# Patient Record
Sex: Female | Born: 1970
Health system: Southern US, Community
[De-identification: ages and names within clinical notes are randomized; demographics above are authoritative.]

## PROBLEM LIST (undated history)

## (undated) DIAGNOSIS — K219 Gastro-esophageal reflux disease without esophagitis: Secondary | ICD-10-CM

## (undated) DIAGNOSIS — R221 Localized swelling, mass and lump, neck: Secondary | ICD-10-CM

## (undated) DIAGNOSIS — K5792 Diverticulitis of intestine, part unspecified, without perforation or abscess without bleeding: Secondary | ICD-10-CM

## (undated) DIAGNOSIS — E049 Nontoxic goiter, unspecified: Secondary | ICD-10-CM

## (undated) DIAGNOSIS — R1310 Dysphagia, unspecified: Secondary | ICD-10-CM

## (undated) DIAGNOSIS — C73 Malignant neoplasm of thyroid gland: Secondary | ICD-10-CM

## (undated) DIAGNOSIS — M2550 Pain in unspecified joint: Secondary | ICD-10-CM

## (undated) DIAGNOSIS — F419 Anxiety disorder, unspecified: Secondary | ICD-10-CM

## (undated) DIAGNOSIS — D649 Anemia, unspecified: Secondary | ICD-10-CM

## (undated) DIAGNOSIS — F32A Depression, unspecified: Secondary | ICD-10-CM

## (undated) DIAGNOSIS — K5900 Constipation, unspecified: Secondary | ICD-10-CM

## (undated) DIAGNOSIS — R5383 Other fatigue: Secondary | ICD-10-CM

## (undated) DIAGNOSIS — E739 Lactose intolerance, unspecified: Secondary | ICD-10-CM

## (undated) DIAGNOSIS — R0602 Shortness of breath: Secondary | ICD-10-CM

## (undated) DIAGNOSIS — J45909 Unspecified asthma, uncomplicated: Secondary | ICD-10-CM

## (undated) DIAGNOSIS — K829 Disease of gallbladder, unspecified: Secondary | ICD-10-CM

## (undated) DIAGNOSIS — T7840XA Allergy, unspecified, initial encounter: Secondary | ICD-10-CM

## (undated) DIAGNOSIS — E785 Hyperlipidemia, unspecified: Secondary | ICD-10-CM

## (undated) DIAGNOSIS — E041 Nontoxic single thyroid nodule: Secondary | ICD-10-CM

## (undated) DIAGNOSIS — D219 Benign neoplasm of connective and other soft tissue, unspecified: Secondary | ICD-10-CM

## (undated) HISTORY — DX: Shortness of breath: R06.02

## (undated) HISTORY — DX: Depression, unspecified: F32.A

## (undated) HISTORY — DX: Nontoxic goiter, unspecified: E04.9

## (undated) HISTORY — DX: Unspecified asthma, uncomplicated: J45.909

## (undated) HISTORY — DX: Benign neoplasm of connective and other soft tissue, unspecified: D21.9

## (undated) HISTORY — DX: Dysphagia, unspecified: R13.10

## (undated) HISTORY — DX: Localized swelling, mass and lump, neck: R22.1

## (undated) HISTORY — DX: Anxiety disorder, unspecified: F41.9

## (undated) HISTORY — DX: Nontoxic single thyroid nodule: E04.1

## (undated) HISTORY — DX: Pain in unspecified joint: M25.50

## (undated) HISTORY — DX: Hyperlipidemia, unspecified: E78.5

## (undated) HISTORY — DX: Allergy, unspecified, initial encounter: T78.40XA

## (undated) HISTORY — DX: Anemia, unspecified: D64.9

## (undated) HISTORY — DX: Gastro-esophageal reflux disease without esophagitis: K21.9

## (undated) HISTORY — DX: Lactose intolerance, unspecified: E73.9

## (undated) HISTORY — DX: Constipation, unspecified: K59.00

## (undated) HISTORY — DX: Diverticulitis of intestine, part unspecified, without perforation or abscess without bleeding: K57.92

## (undated) HISTORY — DX: Other fatigue: R53.83

## (undated) HISTORY — DX: Disease of gallbladder, unspecified: K82.9

---

## 2009-04-11 HISTORY — PX: CHOLECYSTECTOMY: SHX55

## 2013-03-11 DIAGNOSIS — E041 Nontoxic single thyroid nodule: Secondary | ICD-10-CM

## 2013-03-11 HISTORY — DX: Nontoxic single thyroid nodule: E04.1

## 2015-04-12 DIAGNOSIS — D219 Benign neoplasm of connective and other soft tissue, unspecified: Secondary | ICD-10-CM

## 2015-04-12 HISTORY — DX: Benign neoplasm of connective and other soft tissue, unspecified: D21.9

## 2016-08-31 DIAGNOSIS — N83209 Unspecified ovarian cyst, unspecified side: Secondary | ICD-10-CM

## 2016-08-31 DIAGNOSIS — N200 Calculus of kidney: Secondary | ICD-10-CM

## 2016-08-31 HISTORY — DX: Calculus of kidney: N20.0

## 2016-08-31 HISTORY — DX: Unspecified ovarian cyst, unspecified side: N83.209

## 2018-10-01 LAB — LIPID PANEL
Cholesterol: 201 — AB (ref 0–200)
HDL: 39 (ref 35–70)
LDL Cholesterol: 138
Triglycerides: 120 (ref 40–160)

## 2018-10-01 LAB — CBC AND DIFFERENTIAL
HCT: 40 (ref 36–46)
Hemoglobin: 12.8 (ref 12.0–16.0)
Neutrophils Absolute: 4.2
Platelets: 331 (ref 150–399)
WBC: 6.1

## 2018-10-01 LAB — COMPREHENSIVE METABOLIC PANEL
Albumin: 4 (ref 3.5–5.0)
Calcium: 9.2 (ref 8.7–10.7)
GFR calc non Af Amer: 90
Globulin: 1

## 2018-10-01 LAB — HEMOGLOBIN A1C: Hemoglobin A1C: 5.4

## 2018-10-01 LAB — BASIC METABOLIC PANEL
BUN: 13 (ref 4–21)
CO2: 24 — AB (ref 13–22)
Chloride: 109 — AB (ref 99–108)
Creatinine: 0.7 (ref 0.5–1.1)
Glucose: 100
Potassium: 4.1 (ref 3.4–5.3)
Sodium: 144 (ref 137–147)

## 2018-10-01 LAB — HEPATIC FUNCTION PANEL
Alkaline Phosphatase: 49 (ref 25–125)
Bilirubin, Total: 0.4

## 2018-10-01 LAB — CBC: RBC: 4.6 (ref 3.87–5.11)

## 2018-10-01 LAB — TSH: TSH: 1.62 (ref 0.41–5.90)

## 2019-05-13 DIAGNOSIS — F330 Major depressive disorder, recurrent, mild: Secondary | ICD-10-CM | POA: Insufficient documentation

## 2019-06-18 DIAGNOSIS — E041 Nontoxic single thyroid nodule: Secondary | ICD-10-CM | POA: Insufficient documentation

## 2019-06-18 DIAGNOSIS — F419 Anxiety disorder, unspecified: Secondary | ICD-10-CM | POA: Insufficient documentation

## 2019-10-07 LAB — HM MAMMOGRAPHY: HM Mammogram: ABNORMAL — AB (ref 0–4)

## 2020-01-02 ENCOUNTER — Other Ambulatory Visit (HOSPITAL_COMMUNITY): Payer: Self-pay

## 2020-03-18 MED FILL — buPROPion HCL ER (XL) 300 M: 300 | 30 days supply | Qty: 30 | Fill #0

## 2020-04-14 MED FILL — buPROPion HCL ER (XL) 300 M: 300 | 30 days supply | Qty: 30 | Fill #1

## 2020-04-27 ENCOUNTER — Ambulatory Visit: Payer: Self-pay | Admitting: Family Medicine

## 2020-05-11 ENCOUNTER — Other Ambulatory Visit: Payer: Self-pay | Admitting: Family Medicine

## 2020-05-11 ENCOUNTER — Other Ambulatory Visit: Payer: Self-pay

## 2020-05-11 ENCOUNTER — Ambulatory Visit (INDEPENDENT_AMBULATORY_CARE_PROVIDER_SITE_OTHER): Payer: 59 | Admitting: Family Medicine

## 2020-05-11 ENCOUNTER — Encounter: Payer: Self-pay | Admitting: Family Medicine

## 2020-05-11 VITALS — BP 89/60 | HR 103 | Temp 98.4°F | Ht 62.5 in | Wt 187.0 lb

## 2020-05-11 DIAGNOSIS — F419 Anxiety disorder, unspecified: Secondary | ICD-10-CM | POA: Diagnosis not present

## 2020-05-11 DIAGNOSIS — F330 Major depressive disorder, recurrent, mild: Secondary | ICD-10-CM | POA: Diagnosis not present

## 2020-05-11 DIAGNOSIS — E041 Nontoxic single thyroid nodule: Secondary | ICD-10-CM

## 2020-05-11 DIAGNOSIS — Z7689 Persons encountering health services in other specified circumstances: Secondary | ICD-10-CM

## 2020-05-11 DIAGNOSIS — E010 Iodine-deficiency related diffuse (endemic) goiter: Secondary | ICD-10-CM

## 2020-05-11 MED ORDER — BUPROPION HCL ER (XL) 300 MG PO TB24
300.0000 mg | ORAL_TABLET | Freq: Every day | ORAL | 1 refills | Status: DC
Start: 2020-05-11 — End: 2020-05-11

## 2020-05-11 MED ORDER — LORAZEPAM 0.5 MG PO TABS: 0.5000 mg | ORAL_TABLET | Freq: Every day | ORAL | 1 refills | Status: DC | PRN

## 2020-05-11 MED FILL — buPROPion HCL ER (XL) 300 M: 300 | 90 days supply | Qty: 90 | Fill #0

## 2020-05-11 MED FILL — LORazepam 0.5 MG TABS: 0.5 | 90 days supply | Qty: 90 | Fill #0

## 2020-05-11 NOTE — Patient Instructions (Addendum)
Nice to meet you today.   I have refilled your meds. Next appt 5.5 mos. For physical and chronic conditions.

## 2020-05-11 NOTE — Progress Notes (Signed)
Patient ID: Patricia Tran, female  DOB: 1970-04-13, 50 y.o.   MRN: [id number] Patient Care Team    Relationship Specialty Notifications Start End  Ma Hillock, DO PCP - General Family Medicine  05/11/20     Chief Complaint  Patient presents with  . Establish Care    [facility]    Subjective: Patricia Tran is a 50 y.o.  female present for new patient establishment. All past medical history, surgical history, allergies, family history, immunizations, medications and social history were updated in the electronic medical record today. All recent labs, ED visits and hospitalizations within the last year were reviewed.  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 She has been prescribed Wellbutrin 300 mg daily and Ativan 0.5 mg daily as needed. She feels her symptoms are well controlled.  Thyroid nodule- 2015- [place] had been consistent in size.  Has had follow up US after biopsy-but no biopsy recently. Patient endorses having noticed enlargement over her left thyroid again and has concerns she needs to have another ultrasound and follow-up.   No flowsheet data found. No flowsheet data found.       Fall Risk  05/11/2020  Falls in the past year? 0  Number falls in past yr: 0  Injury with Fall? 0  Risk for fall due to : No Fall Risks  Follow up Falls evaluation completed   Immunization History  Administered Date(s) Administered  . PFIZER(Purple Top)SARS-COV-2 Vaccination 11/07/2019, 11/28/2019    No exam data present  Past Medical History:  Diagnosis Date  . Allergy   . Anemia   . Asthma   . Depression   . Diverticulitis   . Fibroids   . Goiter   . Thyroid nodule    Allergies  Allergen Reactions  . Buspar [Buspirone]   . Codeine   . Zithromax [Azithromycin]    Past Surgical History:  Procedure Laterality Date  . CHOLECYSTECTOMY  2011   Family History  Problem Relation  Age of Onset  . Early death Mother   . Kidney disease Mother   . Alcohol abuse Father   . Cancer Father        oral HPV  . Diabetes Maternal Grandmother   . Alcohol abuse Maternal Grandfather   . Heart disease Maternal Grandfather   . Heart attack Paternal Grandmother   . Heart attack Paternal Grandfather    Social History   Social History Narrative   Marital status/children/pets: married, 1 child   Education/employment: BA. Building surveyor.    Safety:      -smoke alarm in the home:Yes     - wears seatbelt: Yes     - Feels safe in their relationships: Yes    Allergies as of 05/11/2020   No Known Allergies     Medication List       Accurate as of May 11, 2020 11:59 PM. If you have any questions, ask your nurse or doctor.        STOP taking these medications   busPIRone 5 MG tablet Commonly known as: BUSPAR Stopped by: Howard Pouch, DO     TAKE these medications   buPROPion 300 MG 24 hr tablet Commonly known as: WELLBUTRIN XL Take 1 tablet (300 mg total) by mouth daily.   LORazepam 0.5 MG tablet Commonly known as: ATIVAN Take 1 tablet (0.5 mg total) by mouth daily as needed for anxiety. What changed:   how  much to take  when to take this  reasons to take this Changed by: Howard Pouch, DO       All past medical history, surgical history, allergies, family history, immunizations andmedications were updated in the EMR today and reviewed under the history and medication portions of their EMR.    No results found for this or any previous visit (from the past 2160 hour(s)).  Patient was never admitted.   ROS: 14 pt review of systems performed and negative (unless mentioned in an HPI)  Objective: BP (!) 89/60   Pulse (!) 103   Temp 98.4 F (36.9 C) (Oral)   Ht 5' 2.5" (1.588 m)   Wt 187 lb (84.8 kg)   LMP 05/01/2020   SpO2 99%   BMI 33.66 kg/m  Gen: Afebrile. No acute distress. Nontoxic in appearance, well-developed, well-nourished,  pleasant female HENT: AT. [place].  Eyes:Pupils Equal Round Reactive to light, Extraocular movements intact,  Conjunctiva without redness, discharge or icterus. Neck/lymp/endocrine: Supple, no lymphadenopathy, left thyromegaly present, no discrete nodules palpated CV: RRR  Chest: CTAB, no wheeze, rhonchi or crackles.  Skin: No rashes, purpura or petechiae. Warm and well-perfused. Skin intact. Neuro/Msk: Normal gait. PERLA. EOMi. Alert. Oriented x3.  Cranial nerves II through XII intact. Muscle strength 5/5 upper/lower extremity. DTRs equal bilaterally. Psych: Normal affect, dress and demeanor. Normal speech. Normal thought content and judgment.   Assessment/plan: Patricia Tran is a 50 y.o. 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 Continue Wellbutrin 300 mg daily Continue Ativan 0.5 mg daily as needed [place] controlled substance database reviewed today. Follow-up 5.5 months. Patient is aware will need face-to-face visit prior to any refills of controlled substances.  Thyroid nodule/thyromegaly Agree with patient she does have enlargement of the left side of her thyroid. Received records from prior PCP, no report on thyroid nodule other than benign left thyroid nodule. Will obtain thyroid ultrasound. Patient will be called with results.    Return in about 23 weeks (around 10/19/2020) for CPE (30 min), [facility] (30 min).  Orders Placed This Encounter  Procedures  . HM MAMMOGRAPHY  . US THYROID   Meds ordered this encounter  Medications  . buPROPion (WELLBUTRIN XL) 300 MG 24 hr tablet    Sig: Take 1 tablet (300 mg total) by mouth daily.    Dispense:  90 tablet    Refill:  1  . LORazepam (ATIVAN) 0.5 MG tablet    Sig: Take 1 tablet (0.5 mg total) by mouth daily as needed for anxiety.    Dispense:  90 tablet    Refill:  1   Referral Orders  No referral(s)  requested today     Note is dictated utilizing voice recognition software. Although note has been proof read prior to signing, occasional typographical errors still can be missed. If any questions arise, please do not hesitate to call for verification.  Electronically signed by: Howard Pouch, DO [place]

## 2020-05-18 ENCOUNTER — Encounter: Payer: Self-pay | Admitting: Family Medicine

## 2020-05-26 ENCOUNTER — Encounter: Payer: Self-pay | Admitting: Family Medicine

## 2020-05-27 ENCOUNTER — Encounter: Payer: Self-pay | Admitting: Family Medicine

## 2020-05-27 ENCOUNTER — Other Ambulatory Visit: Payer: Self-pay

## 2020-05-27 ENCOUNTER — Ambulatory Visit
Admission: RE | Admit: 2020-05-27 | Discharge: 2020-05-27 | Disposition: A | Discharge status: Home / Self Care | Payer: 59 | Source: Ambulatory Visit | Attending: Family Medicine | Admitting: Family Medicine

## 2020-05-27 DIAGNOSIS — E010 Iodine-deficiency related diffuse (endemic) goiter: Secondary | ICD-10-CM

## 2020-05-27 DIAGNOSIS — E041 Nontoxic single thyroid nodule: Secondary | ICD-10-CM

## 2020-05-28 ENCOUNTER — Telehealth: Payer: Self-pay | Admitting: Family Medicine

## 2020-05-28 DIAGNOSIS — E041 Nontoxic single thyroid nodule: Secondary | ICD-10-CM

## 2020-05-28 NOTE — Telephone Encounter (Signed)
Talk to patient about her results today.  We have schedule her for fine-needle aspiration of her thyroid nodule.  She has also been referred to establish with endocrine moving forward.  Patient request Dr. Sarina Ser, for who also takes care of her husband.  Patient was appreciative for the call and understands plan.

## 2020-05-29 ENCOUNTER — Other Ambulatory Visit: Payer: Self-pay | Admitting: Family Medicine

## 2020-05-29 DIAGNOSIS — E041 Nontoxic single thyroid nodule: Secondary | ICD-10-CM

## 2020-06-05 ENCOUNTER — Ambulatory Visit: Payer: 59 | Attending: Internal Medicine

## 2020-06-05 DIAGNOSIS — Z23 Encounter for immunization: Secondary | ICD-10-CM

## 2020-06-05 NOTE — Progress Notes (Signed)
   Covid-19 Vaccination Clinic  Name:  Patricia Tran    MRN: [id number] DOB: [birth year]  06/05/2020  [name] was observed post Covid-19 immunization for 15 minutes without incident. She was provided with Vaccine Information Sheet and instruction to access the V-Safe system.   [name] was instructed to call 911 with any severe reactions post vaccine: Marland Kitchen Difficulty breathing  . Swelling of face and throat  . A fast heartbeat  . A bad rash all over body  . Dizziness and weakness   Immunizations Administered    Name Date Dose VIS Date Route   PFIZER Comrnaty(Gray TOP) Covid-19 Vaccine 06/05/2020  3:00 PM 0.3 mL 03/19/2020 Intramuscular   Manufacturer: [place]   Lot: [id number]   NDC: [phone]

## 2020-06-06 ENCOUNTER — Encounter: Payer: Self-pay | Admitting: Internal Medicine

## 2020-06-08 NOTE — Telephone Encounter (Signed)
Please let her know that I am not supposed to give any medical advise to someone who has not established patient / physician relationship.  But let her know [facility] does not do Thyroseq , only [place] and her doctor ordered this appropriately

## 2020-06-09 DIAGNOSIS — D440 Neoplasm of uncertain behavior of thyroid gland: Secondary | ICD-10-CM | POA: Diagnosis not present

## 2020-06-09 HISTORY — PX: BIOPSY THYROID: PRO38

## 2020-06-11 ENCOUNTER — Other Ambulatory Visit: Payer: 59

## 2020-06-15 ENCOUNTER — Ambulatory Visit
Admission: RE | Admit: 2020-06-15 | Discharge: 2020-06-15 | Disposition: A | Discharge status: Home / Self Care | Payer: 59 | Source: Ambulatory Visit | Attending: Family Medicine | Admitting: Family Medicine

## 2020-06-15 ENCOUNTER — Other Ambulatory Visit: Payer: Self-pay

## 2020-06-15 DIAGNOSIS — E041 Nontoxic single thyroid nodule: Secondary | ICD-10-CM | POA: Diagnosis not present

## 2020-06-15 DIAGNOSIS — D440 Neoplasm of uncertain behavior of thyroid gland: Secondary | ICD-10-CM | POA: Insufficient documentation

## 2020-06-15 NOTE — Discharge Instructions (Signed)
Thyroid Needle Biopsy, Care After This sheet gives you information about how to care for yourself after your procedure. Your health care provider may also give you more specific instructions. If you have problems or questions, contact your health care provider. What can I expect after the procedure? After the procedure, it is common to have:  Soreness and tenderness that lasts for a few days.  Bruising where the needle was inserted (puncture site). Follow these instructions at home:  Take over-the-counter and prescription medicines only as told by your health care provider.  To help ease discomfort, keep your head raised (elevated) when you are lying down. When you move from lying down to sitting up, use both hands to support the back of your head and neck.  Check your puncture site every day for signs of infection. Check for: ? Redness, swelling, or pain. ? Fluid or blood. ? Warmth. ? Pus or a bad smell.  Return to your normal activities as told by your health care provider. Ask your health care provider what activities are safe for you.  Keep all follow-up visits as told by your health care provider. This is important.   Contact a health care provider if:  You have redness, swelling, or pain around your puncture site.  You have fluid or blood coming from your puncture site.  Your puncture site feels warm to the touch.  You have pus or a bad smell coming from your puncture site.  You have a fever. Get help right away if:  You have severe bleeding from the puncture site.  You have difficulty swallowing.  You have swollen glands (lymph nodes) in your neck. Summary  It is common to have some bruising and soreness where the needle was inserted in your lower front neck area (puncture site).  Check your puncture site every day for signs of infection, such as redness, swelling, or pain.  Get help right away if you have severe bleeding from your puncture site. This  information is not intended to replace advice given to you by your health care provider. Make sure you discuss any questions you have with your health care provider. Document Revised: 12/05/2019 Document Reviewed: 12/05/2019 Elsevier Patient Education  2021 Elsevier Inc.  

## 2020-06-15 NOTE — Procedures (Signed)
Pre Procedure Dx: Indeterminate left sided thyroid nodule Post Procedural Dx: Same  Technically successful US guided biopsy of indeterminate left sided thyroid nodule.   EBL: None  No immediate complications.   Ronny Bacon, MD Pager #: [phone]

## 2020-06-25 ENCOUNTER — Telehealth: Payer: Self-pay | Admitting: Family Medicine

## 2020-06-25 NOTE — Telephone Encounter (Signed)
Received results from thyroid biopsy 06/15/2020 today (06/25/2020) at 1:15 PM.  Patient was called with her results immediately, which were "suspicious for follicular neoplasm." -Additional testing has been ordered by pathologist, which will take approximately 2-3 weeks to receive.  Patient has and establishment appointment scheduled with Dr. Kelton Pillar at [facility] July 15, 2020.  Patient appreciated the call and had no further questions.

## 2020-06-30 ENCOUNTER — Encounter: Payer: Self-pay | Admitting: Internal Medicine

## 2020-06-30 ENCOUNTER — Telehealth: Payer: Self-pay

## 2020-06-30 ENCOUNTER — Encounter: Payer: Self-pay | Admitting: Family Medicine

## 2020-06-30 DIAGNOSIS — E041 Nontoxic single thyroid nodule: Secondary | ICD-10-CM

## 2020-06-30 DIAGNOSIS — R899 Unspecified abnormal finding in specimens from other organs, systems and tissues: Secondary | ICD-10-CM

## 2020-06-30 LAB — CYTOLOGY - NON PAP

## 2020-06-30 NOTE — Telephone Encounter (Addendum)
Called Patricia Tran 06/30/2020 at 4:12 pm and discussed molecular pathology report with patient.  She would like to establish with endocrine (appointment in 2 weeks) and look into her options,  prior to surgical referral.   She will call us back if needing surgical referral placed, she is desiring to look into surgical/surgeon options with her insurance first.  Patient was appreciative of call.  Forward result to the establishing endocrinologist- Dr. Kelton Pillar.  Result is available under results tab .  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

## 2020-06-30 NOTE — Telephone Encounter (Signed)
CRITICAL VALUE STICKER  Received call 06/30/20 1600 from Dr. Ronnald Ramp with radiology, stating that the pathology results for molecular testing has came back positive and make her at high risk for cancer.

## 2020-07-01 ENCOUNTER — Telehealth: Payer: Self-pay | Admitting: Internal Medicine

## 2020-07-01 NOTE — Telephone Encounter (Signed)
NOted. I will try and see her this week    Thank you

## 2020-07-01 NOTE — Telephone Encounter (Signed)
Referral request for surgical provider at [facility] (Dr. Kathi Simpers)

## 2020-07-01 NOTE — Telephone Encounter (Signed)
Please inform patient I have placed that referral to the provider she requested at [facility].

## 2020-07-01 NOTE — Telephone Encounter (Signed)
Please bring the pt in this week if she can ? Can offer 11:30 if available. We can also bring her next week    Needs to be seen sooner    Thanks

## 2020-07-02 ENCOUNTER — Encounter: Payer: Self-pay | Admitting: Internal Medicine

## 2020-07-02 ENCOUNTER — Other Ambulatory Visit: Payer: Self-pay

## 2020-07-02 ENCOUNTER — Ambulatory Visit: Payer: 59 | Admitting: Internal Medicine

## 2020-07-02 VITALS — BP 120/72 | HR 97 | Resp 20 | Ht 63.0 in | Wt 189.4 lb

## 2020-07-02 DIAGNOSIS — E042 Nontoxic multinodular goiter: Secondary | ICD-10-CM | POA: Diagnosis not present

## 2020-07-02 NOTE — Progress Notes (Addendum)
Name: Patricia Tran  MRN/ DOB: [id number], Aug 14, 1970    Age/ Sex: 50 y.o., female    PCP: Ma Hillock, DO   Reason for Endocrinology Evaluation: MNG     Date of Initial Endocrinology Evaluation: 07/02/2020     HPI: Patricia Tran is a 50 y.o. female with a past medical history of MNG, Depression/anxiety  The patient presented for initial endocrinology clinic visit on 07/02/2020 for consultative assistance with her MNG.   She is accompanied with  her spouse today     She has been diagnosed with MNG in 2014 . She is S/P left thyroid nodule  FNA in 4259 with follicular lesion,  Unable to complete molecular testing due to insufficient material. Repeat FNA in 5638 showed follicular lesion of undetermined significance, thyroseq was benign at the time. Serial ultrasound has shown stability , until 2022 when she noted a local neck enlargement.   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   Thyroseq came back positive with a 60 % cancer risk.   She has tenderness around the neck , chokes occasionally   Has alternating constipation and diarrhea   Has excessive menstrual bleeding as well as fibroids, has not established with a new gynecologist yet. 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   HISTORY:  Past Medical History:  . Past Medical History:  Diagnosis Date  . Allergy   . Anemia   . Asthma   . Depression   . Diverticulitis   . Fibroids 2017   left sided uterine leiomyoma   . Goiter   . Kidney stone 08/31/2016   incidental find on CT right renal calculus  . Ovarian cyst 08/31/2016   1.5 cm left ovarian cyst  . Thyroid nodule 03/2013   2.6x1.9x1.8 cm on last US thyroid 02/2015- "stable".  lg mixed schogenic nodule  left thyroid. "Follicular lesion of unknown significance"    Past Surgical History:  . Past Surgical History:  Procedure Laterality Date  . CHOLECYSTECTOMY  2011      Social History:  reports that she has never smoked. She has never used smokeless tobacco. She reports current alcohol use of about 2.0 standard drinks of alcohol per week. She reports that she does not use drugs.  Family History: family history includes Alcohol abuse in her father and maternal grandfather; Cancer in her father; Diabetes in her maternal grandmother; Early death in her mother; Heart attack in her paternal grandfather and paternal grandmother; Heart disease in her maternal grandfather; Kidney disease in her mother.   HOME MEDICATIONS: Allergies as of 07/02/2020      Reactions   Buspar [buspirone]    Codeine    Zithromax [azithromycin]       Medication List       Accurate as of July 02, 2020  7:07 AM. If you have any questions, ask your nurse or doctor.        buPROPion 300 MG 24 hr tablet Commonly known as: WELLBUTRIN XL Take 1 tablet (300 mg total) by mouth daily.   LORazepam 0.5 MG tablet Commonly known as: ATIVAN Take 1 tablet (0.5 mg total) by mouth daily as needed for anxiety.  REVIEW OF SYSTEMS: A comprehensive ROS was conducted with the patient and is negative except as per HPI    OBJECTIVE:  VS: BP 120/72 (BP Location: Right Arm, Patient Position: Sitting, Cuff Size: Normal)   Pulse 97   Resp 20   Ht 5\' 3"  (1.6 m)   Wt 189 lb 6.4 oz (85.9 kg)   SpO2 99%   BMI 33.55 kg/m    Wt Readings from Last 3 Encounters:  05/11/20 187 lb (84.8 kg)     EXAM: General: Pt appears well and is in NAD  Neck: General: Supple without adenopathy. Thyroid: Left thyroid nodule appreciated.   Lungs: Clear with good BS bilat with no rales, rhonchi, or wheezes  Heart: Auscultation: RRR.  Abdomen: Normoactive bowel sounds, soft, nontender, without masses or organomegaly palpable   Extremities:  BL LE: No pretibial edema normal ROM and strength.  Skin: Hair: Texture and amount normal with gender appropriate distribution Skin Inspection: No rashes Skin Palpation: Skin temperature, texture, and thickness normal to palpation  Neuro: Cranial nerves: II - XII grossly intact  Motor: Normal strength throughout DTRs: 2+ and symmetric in UE without delay in relaxation phase  Mental Status: Judgment, insight: Intact Memory: Intact for recent and remote events Mood and affect: No depression, anxiety, or agitation     DATA REVIEWED: 01/01/2020 LDL 134 mg/dl  HDL 43    BUN/CR 14/0.85 TSH 3.22  Estradiol 376 pg/ml   Thyroid ULtrasound 05/27/2020   Nodule # 1:  Location: Right; Inferior  Maximum size: 1.1 cm; Other 2 dimensions: 0.8 x 0.8 cm  Composition: solid/almost completely solid (2)  Echogenicity: hypoechoic (2)  Shape: not taller-than-wide (0)  Margins: ill-defined (0)  Echogenic foci: none (0)  ACR TI-RADS total points: 4.  ACR TI-RADS risk category: TR4 (4-6 points).  ACR TI-RADS recommendations:  *Given size (>/= 1 - 1.4 cm) and appearance, a follow-up ultrasound in 1 year should be considered based on TI-RADS criteria.  _________________________________________________________  Nodule # 2:  Location: Left; mid  Maximum size: 4.8 cm; Other 2 dimensions: 3.0 x 2.4 cm  Composition: solid/almost completely solid (2)  Echogenicity: hypoechoic (2)  Shape: not taller-than-wide (0)  Margins: ill-defined (0)  Echogenic foci: none (0)  ACR TI-RADS total points: 4.  ACR TI-RADS risk category: TR4 (4-6 points).  ACR TI-RADS recommendations:  **Given size (>/= 1.5 cm) and appearance, fine needle aspiration of this moderately suspicious nodule should be considered based on TI-RADS criteria.  _________________________________________________________  IMPRESSION: 1. Nodule 2 (TI-RADS 4) located in the mid  left thyroid lobe, measuring 4.8 x 3.0 x 2.4 cm, meets criteria for FNA. 2. Nodule 1 (TI-RADS 4) located in the inferior right thyroid lobe, measuring 1.1 x 0.8 x 0.8 cm, meets criteria for imaging follow-up with next ultrasound recommended in 1 year.    FNA 06/15/2020   Specimen Submitted:  A. Thyroid, left lobe   Clinical History: Indeterminate left sided 4.8 cm TR4 thyroid nodule,  post US guided bx.     DIAGNOSIS:  A. 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     Thyroseq  Positive with a 60% risk for cancer      Thyroid ultrasound 02/26/2015  There is a presence of several nodules with the largest measuring 1.1 x 0.7 x 0.6 cm that has remained stable Left thyroid nodule measuring 2.6 x 1.9 x 1.8 cm that has remained stable ASSESSMENT/PLAN/RECOMMENDATIONS:   1. Multinodular Goiter:  -  Patient with local neck symptoms -She is biochemically euthyroid - S/P left thyroid nodule FNA with a cytology report revealing suspicious for follicular neoplasm.  - Thyroseq positive  -She is considering lobectomy, she is interested in robotic thyroid surgery but she is not sure she will qualify given the size of the left thyroid nodule. -I have recommended referral to our local surgeon which she is in agreement of this -We did discuss the presence of a right thyroid nodule and it would not be unreasonable to consider total thyroidectomy -Of note I have reviewed her ultrasound results from 2016 and the right thyroid nodule appears to be stable at 1.1 cm  Follow-up 6 weeks postop Signed electronically by: Mack Guise, MD  [facility]  [facility] [address] [place], [place] [address] Phone: [phone] FAX: [phone]   CC: Ma Hillock, DO [address] [place] [address] Phone: [phone] Fax: [phone]   Return to  Endocrinology clinic as below: Future Appointments  Date Time Provider [place]  07/02/2020 11:30 AM Xoie Kreuser, Melanie Crazier, MD [facility] None  10/19/2020  8:30 AM Kuneff, Renee A, DO [facility]

## 2020-07-03 ENCOUNTER — Encounter: Payer: Self-pay | Admitting: Internal Medicine

## 2020-07-15 ENCOUNTER — Ambulatory Visit: Payer: 59 | Admitting: Internal Medicine

## 2020-07-21 DIAGNOSIS — Z01812 Encounter for preprocedural laboratory examination: Secondary | ICD-10-CM | POA: Diagnosis not present

## 2020-08-07 ENCOUNTER — Other Ambulatory Visit: Payer: Self-pay | Admitting: Family Medicine

## 2020-08-07 DIAGNOSIS — Z1231 Encounter for screening mammogram for malignant neoplasm of breast: Secondary | ICD-10-CM

## 2020-08-09 HISTORY — PX: OTHER SURGICAL HISTORY: SHX169

## 2020-08-11 ENCOUNTER — Encounter (INDEPENDENT_AMBULATORY_CARE_PROVIDER_SITE_OTHER): Payer: Self-pay | Admitting: Family Medicine

## 2020-08-11 ENCOUNTER — Other Ambulatory Visit: Payer: Self-pay

## 2020-08-11 ENCOUNTER — Ambulatory Visit (INDEPENDENT_AMBULATORY_CARE_PROVIDER_SITE_OTHER): Payer: 59 | Admitting: Family Medicine

## 2020-08-11 VITALS — BP 107/67 | HR 90 | Temp 97.7°F | Ht 63.0 in | Wt 185.0 lb

## 2020-08-11 DIAGNOSIS — E669 Obesity, unspecified: Secondary | ICD-10-CM

## 2020-08-11 DIAGNOSIS — R5383 Other fatigue: Secondary | ICD-10-CM

## 2020-08-11 DIAGNOSIS — R0602 Shortness of breath: Secondary | ICD-10-CM | POA: Diagnosis not present

## 2020-08-11 DIAGNOSIS — Z1331 Encounter for screening for depression: Secondary | ICD-10-CM

## 2020-08-11 DIAGNOSIS — Z0289 Encounter for other administrative examinations: Secondary | ICD-10-CM

## 2020-08-11 DIAGNOSIS — F418 Other specified anxiety disorders: Secondary | ICD-10-CM | POA: Diagnosis not present

## 2020-08-11 DIAGNOSIS — E785 Hyperlipidemia, unspecified: Secondary | ICD-10-CM

## 2020-08-11 DIAGNOSIS — Z6832 Body mass index (BMI) 32.0-32.9, adult: Secondary | ICD-10-CM

## 2020-08-11 DIAGNOSIS — Z9189 Other specified personal risk factors, not elsewhere classified: Secondary | ICD-10-CM

## 2020-08-12 LAB — CBC WITH DIFFERENTIAL
Basophils Absolute: 0 10*3/uL (ref 0.0–0.2)
Basos: 1 %
EOS (ABSOLUTE): 0.1 10*3/uL (ref 0.0–0.4)
Eos: 1 %
Hematocrit: 39.9 % (ref 34.0–46.6)
Hemoglobin: 12.6 g/dL (ref 11.1–15.9)
Immature Grans (Abs): 0 10*3/uL (ref 0.0–0.1)
Immature Granulocytes: 0 %
Lymphocytes Absolute: 1.9 10*3/uL (ref 0.7–3.1)
Lymphs: 28 %
MCH: 27.4 pg (ref 26.6–33.0)
MCHC: 31.6 g/dL (ref 31.5–35.7)
MCV: 87 fL (ref 79–97)
Monocytes Absolute: 0.4 10*3/uL (ref 0.1–0.9)
Monocytes: 7 %
Neutrophils Absolute: 4.2 10*3/uL (ref 1.4–7.0)
Neutrophils: 63 %
RBC: 4.6 x10E6/uL (ref 3.77–5.28)
RDW: 15.1 % (ref 11.7–15.4)
WBC: 6.6 10*3/uL (ref 3.4–10.8)

## 2020-08-12 LAB — COMPREHENSIVE METABOLIC PANEL
ALT: 30 IU/L (ref 0–32)
AST: 19 IU/L (ref 0–40)
Albumin/Globulin Ratio: 1.4 (ref 1.2–2.2)
Albumin: 3.9 g/dL (ref 3.8–4.9)
Alkaline Phosphatase: 89 IU/L (ref 44–121)
BUN/Creatinine Ratio: 15 (ref 9–23)
BUN: 12 mg/dL (ref 6–24)
Bilirubin Total: 0.3 mg/dL (ref 0.0–1.2)
CO2: 22 mmol/L (ref 20–29)
Calcium: 8.8 mg/dL (ref 8.7–10.2)
Chloride: 105 mmol/L (ref 96–106)
Creatinine, Ser: 0.82 mg/dL (ref 0.57–1.00)
Globulin, Total: 2.8 g/dL (ref 1.5–4.5)
Glucose: 80 mg/dL (ref 65–99)
Potassium: 4.4 mmol/L (ref 3.5–5.2)
Sodium: 142 mmol/L (ref 134–144)
Total Protein: 6.7 g/dL (ref 6.0–8.5)
eGFR: 87 mL/min/{1.73_m2} (ref >59)

## 2020-08-12 LAB — FOLATE: Folate: 18.6 ng/mL (ref >3.0)

## 2020-08-12 LAB — VITAMIN D 25 HYDROXY (VIT D DEFICIENCY, FRACTURES): Vit D, 25-Hydroxy: 33.7 ng/mL (ref 30.0–100.0)

## 2020-08-12 LAB — HEMOGLOBIN A1C
Est. average glucose Bld gHb Est-mCnc: 108 mg/dL
Hgb A1c MFr Bld: 5.4 % (ref 4.8–5.6)

## 2020-08-12 LAB — LIPID PANEL WITH LDL/HDL RATIO
Cholesterol, Total: 195 mg/dL (ref 100–199)
HDL: 42 mg/dL (ref >39)
LDL Chol Calc (NIH): 130 mg/dL — ABNORMAL HIGH (ref 0–99)
LDL/HDL Ratio: 3.1 ratio (ref 0.0–3.2)
Triglycerides: 127 mg/dL (ref 0–149)
VLDL Cholesterol Cal: 23 mg/dL (ref 5–40)

## 2020-08-12 LAB — VITAMIN B12: Vitamin B-12: 493 pg/mL (ref 232–1245)

## 2020-08-12 LAB — INSULIN, RANDOM: INSULIN: 10.2 u[IU]/mL (ref 2.6–24.9)

## 2020-08-12 MED FILL — Bupropion HCl Tab ER 24HR 300 MG: ORAL | 90 days supply | Qty: 90 | Fill #0 | Status: CN

## 2020-08-12 MED FILL — Lorazepam Tab 0.5 MG: ORAL | 90 days supply | Qty: 90 | Fill #0 | Status: CN

## 2020-08-12 NOTE — Progress Notes (Signed)
Chief Complaint:   OBESITY Patricia Tran (MR# [id number]) is a 50 y.o. female who presents for evaluation and treatment of obesity and related comorbidities. Current BMI is Body mass index is 32.77 kg/m. Patricia Tran has been struggling with her weight for many years and has been unsuccessful in either losing weight, maintaining weight loss, or reaching her healthy weight goal.  Patricia Tran is currently in the action stage of change and ready to dedicate time achieving and maintaining a healthier weight. Patricia Tran is interested in becoming our patient and working on intensive lifestyle modifications including (but not limited to) diet and exercise for weight loss.  Patricia Tran's habits were reviewed today and are as follows: Her family eats meals together, she thinks her family will eat healthier with her, she struggles with family and or coworkers weight loss sabotage, her desired weight loss is 35 lbs, she has been heavy most of her life, she started gaining weight post partum in 1998, her heaviest weight ever was 195 pounds, she is a picky eater and doesn't like to eat healthier foods, she has significant food cravings issues, she snacks frequently in the evenings, she skips meals frequently, she is frequently drinking liquids with calories, she frequently makes poor food choices, she has problems with excessive hunger, she frequently eats larger portions than normal, she has binge eating behaviors and she struggles with emotional eating.  Depression Screen Patricia Tran's Food and Mood (modified PHQ-9) score was 14.  Depression screen PHQ 2/9 08/11/2020  Decreased Interest 2  Down, Depressed, Hopeless 3  PHQ - 2 Score 5  Altered sleeping 0  Tired, decreased energy 0  Change in appetite 0  Feeling bad or failure about yourself  0  Trouble concentrating 0  Moving slowly or fidgety/restless 0  Suicidal thoughts 0  PHQ-9 Score 5  Difficult doing work/chores Not difficult at all   Subjective:   1. Other  fatigue Kirsty notes feeling "exhausted", but is not certain this is weight related. She admits to daytime somnolence and admits to waking up still tired. Patent has a history of symptoms of daytime fatigue. Patricia Tran generally gets 8 hours of sleep per night, and states that she has generally restful sleep. Snoring is present. Apneic episodes are not present. Epworth Sleepiness Score is 5.  2. SOB (shortness of breath) on exertion Patricia Tran notes increasing shortness of breath with exercising and seems to be worsening over time with weight gain. She notes getting out of breath sooner with activity than she used to. This has not gotten worse recently. Patricia Tran denies shortness of breath at rest or orthopnea.  3. Hyperlipidemia, unspecified hyperlipidemia type Patricia Tran is working on diet and weight loss. She is not on statin currently.  4. Depression with anxiety Patricia Tran's diagnosis is treated with medications. Se notes emotional eating behaviors, which contributes to her weight loss struggles.  5. At risk for heart disease Patricia Tran is at a higher than average risk for cardiovascular disease due to obesity.   Assessment/Plan:   1. Other fatigue Patricia Tran does not feel that her weight is causing her energy to be lower than it should be. Fatigue may be related to obesity, depression or many other causes. Labs will be ordered, and in the meanwhile, Patricia Tran will focus on self care including making healthy food choices, increasing physical activity and focusing on stress reduction.  - Insulin, random - Comprehensive metabolic panel - Hemoglobin A1c - Vitamin B12 - VITAMIN D 25 Hydroxy (Vit-D Deficiency, Fractures) - Folate -  CBC With Differential - EKG 12-Lead  2. SOB (shortness of breath) on exertion IC was attempted today, but unable to complete. We will attempt again at her next visit. Patricia Tran has agreed to work on weight loss and gradually increase exercise to treat her exercise induced shortness of breath. Will continue to  monitor closely.  3. Hyperlipidemia, unspecified hyperlipidemia type Cardiovascular risk and specific lipid/LDL goals reviewed. We discussed several lifestyle modifications today. Patricia Tran will start her diet, and will continue working on exercise and weight loss efforts. We will check labs today. Orders and follow up as documented in patient record.   - Lipid Panel With LDL/HDL Ratio  4. Depression with anxiety Behavior modification techniques were discussed today to help Patricia Tran deal with her depression and anxiety. We will refer Patricia Tran to Dr. Mallie Mussel, our Bariatric Psychologist for evaluation and treatment. Orders and follow up as documented in patient record.   5. At risk for heart disease Patricia Tran was given approximately 30 minutes of coronary artery disease prevention counseling today. She is 50 y.o. female and has risk factors for heart disease including obesity. We discussed intensive lifestyle modifications today with an emphasis on specific weight loss instructions and strategies.   Repetitive spaced learning was employed today to elicit superior memory formation and behavioral change.  6. Obesity with current BMI of 32.9 Patricia Tran is currently in the action stage of change and her goal is to continue with weight loss efforts. I recommend Patricia Tran begin the structured treatment plan as follows:  She has agreed to the Category 2 Plan + 100 calories.  Exercise goals: No exercise has been prescribed for now, while we concentrate on nutritional changes.   Behavioral modification strategies: no skipping meals.  She was informed of the importance of frequent follow-up visits to maximize her success with intensive lifestyle modifications for her multiple health conditions. She was informed we would discuss her lab results at her next visit unless there is a critical issue that needs to be addressed sooner. Patricia Tran agreed to keep her next visit at the agreed upon time to discuss these results.  Objective:    Blood pressure 107/67, pulse 90, temperature 97.7 F (36.5 C), height 5\' 3"  (1.6 m), weight 185 lb (83.9 kg), SpO2 99 %. Body mass index is 32.77 kg/m.  EKG: Normal sinus rhythm, rate 82 BPM.  Indirect Calorimeter completed today shows a VO2 of (unable to obtain) and a REE of (unable to obtain).  Her calculated basal metabolic rate is 2992 thus her basal metabolic rate is (unable to obtain) than expected.  General: Cooperative, alert, well developed, in no acute distress. HEENT: Conjunctivae and lids unremarkable. Cardiovascular: Regular rhythm.  Lungs: Normal work of breathing. Neurologic: No focal deficits.   Lab Results  Component Value Date   CREATININE 0.82 08/11/2020   BUN 12 08/11/2020   NA 142 08/11/2020   K 4.4 08/11/2020   CL 105 08/11/2020   CO2 22 08/11/2020   Lab Results  Component Value Date   ALT 30 08/11/2020   AST 19 08/11/2020   ALKPHOS 89 08/11/2020   BILITOT 0.3 08/11/2020   Lab Results  Component Value Date   HGBA1C 5.4 08/11/2020   HGBA1C 5.4 10/01/2018   Lab Results  Component Value Date   INSULIN 10.2 08/11/2020   Lab Results  Component Value Date   TSH 1.62 10/01/2018   Lab Results  Component Value Date   CHOL 195 08/11/2020   HDL 42 08/11/2020   LDLCALC 130 (  H) 08/11/2020   TRIG 127 08/11/2020   Lab Results  Component Value Date   WBC 6.6 08/11/2020   HGB 12.6 08/11/2020   HCT 39.9 08/11/2020   MCV 87 08/11/2020   PLT 331 10/01/2018   No results found for: IRON, TIBC, FERRITIN  Attestation Statements:   Reviewed by clinician on day of visit: allergies, medications, problem list, medical history, surgical history, family history, social history, and previous encounter notes.   I, Trixie Dredge, am acting as transcriptionist for Dennard Nip, MD.  I have reviewed the above documentation for accuracy and completeness, and I agree with the above. - Dennard Nip, MD

## 2020-08-13 ENCOUNTER — Other Ambulatory Visit (HOSPITAL_COMMUNITY): Payer: Self-pay

## 2020-08-18 ENCOUNTER — Encounter (INDEPENDENT_AMBULATORY_CARE_PROVIDER_SITE_OTHER): Payer: Self-pay | Admitting: Family Medicine

## 2020-08-19 NOTE — Telephone Encounter (Signed)
Pt last seen by Dr. Beasley.  

## 2020-08-20 NOTE — Telephone Encounter (Signed)
I have talked to this patient.  The plan is to touch base with her on Sunday.  She is going to try to keep this appt.

## 2020-08-21 ENCOUNTER — Other Ambulatory Visit (HOSPITAL_COMMUNITY): Payer: Self-pay

## 2020-08-21 MED FILL — Bupropion HCl Tab ER 24HR 300 MG: ORAL | 90 days supply | Qty: 90 | Fill #0 | Status: AC

## 2020-08-21 MED FILL — Lorazepam Tab 0.5 MG: ORAL | 90 days supply | Qty: 90 | Fill #0 | Status: AC

## 2020-08-25 ENCOUNTER — Ambulatory Visit (INDEPENDENT_AMBULATORY_CARE_PROVIDER_SITE_OTHER): Payer: 59 | Admitting: Family Medicine

## 2020-08-25 ENCOUNTER — Encounter (INDEPENDENT_AMBULATORY_CARE_PROVIDER_SITE_OTHER): Payer: Self-pay | Admitting: Family Medicine

## 2020-08-25 ENCOUNTER — Other Ambulatory Visit (HOSPITAL_COMMUNITY): Payer: Self-pay

## 2020-08-25 ENCOUNTER — Other Ambulatory Visit: Payer: Self-pay

## 2020-08-25 VITALS — BP 118/79 | HR 111 | Temp 98.1°F | Ht 63.0 in | Wt 182.0 lb

## 2020-08-25 DIAGNOSIS — E7849 Other hyperlipidemia: Secondary | ICD-10-CM

## 2020-08-25 DIAGNOSIS — E559 Vitamin D deficiency, unspecified: Secondary | ICD-10-CM | POA: Diagnosis not present

## 2020-08-25 DIAGNOSIS — E041 Nontoxic single thyroid nodule: Secondary | ICD-10-CM | POA: Diagnosis not present

## 2020-08-25 DIAGNOSIS — E669 Obesity, unspecified: Secondary | ICD-10-CM

## 2020-08-25 DIAGNOSIS — E8881 Metabolic syndrome: Secondary | ICD-10-CM

## 2020-08-25 DIAGNOSIS — Z9189 Other specified personal risk factors, not elsewhere classified: Secondary | ICD-10-CM | POA: Diagnosis not present

## 2020-08-25 DIAGNOSIS — Z6832 Body mass index (BMI) 32.0-32.9, adult: Secondary | ICD-10-CM | POA: Diagnosis not present

## 2020-08-25 MED ORDER — VITAMIN D (ERGOCALCIFEROL) 1.25 MG (50000 UNIT) PO CAPS
50000.0000 [IU] | ORAL_CAPSULE | ORAL | 0 refills | Status: DC
  Filled 2020-08-25: qty 4, 28d supply, fill #0

## 2020-08-26 ENCOUNTER — Encounter: Payer: 59 | Admitting: Family Medicine

## 2020-08-26 DIAGNOSIS — E8881 Metabolic syndrome: Secondary | ICD-10-CM | POA: Insufficient documentation

## 2020-08-26 DIAGNOSIS — E559 Vitamin D deficiency, unspecified: Secondary | ICD-10-CM | POA: Insufficient documentation

## 2020-08-26 DIAGNOSIS — E7849 Other hyperlipidemia: Secondary | ICD-10-CM | POA: Insufficient documentation

## 2020-08-26 DIAGNOSIS — E88819 Insulin resistance, unspecified: Secondary | ICD-10-CM | POA: Insufficient documentation

## 2020-08-26 LAB — T3: T3, Total: 119 ng/dL (ref 71–180)

## 2020-08-26 LAB — TSH: TSH: 1.48 u[IU]/mL (ref 0.450–4.500)

## 2020-08-26 LAB — T4, FREE: Free T4: 1.2 ng/dL (ref 0.82–1.77)

## 2020-08-26 NOTE — Progress Notes (Signed)
Chief Complaint:   OBESITY Patricia Tran is here to discuss her progress with her obesity treatment plan along with follow-up of her obesity related diagnoses. Patricia Tran is on the Category 2 Plan + 100 calories and states she is following her eating plan approximately 90-95% of the time. Patricia Tran states she is walking for 60 minutes 5 times per week.  Today's visit was #: 2 Starting weight: 185 lbs Starting date: 08/11/2020 Today's weight: 182 lbs Today's date: 08/25/2020 Total lbs lost to date: 3 Total lbs lost since last in-office visit: 3  Interim History: Patricia Tran has done well with weight loss on her plan. Her hunger was mostly controlled, but she struggled to eat all of her lunch at times.  Subjective:   1. Other hyperlipidemia Patricia Tran's LDL is elevated and she is not on statin. She is working on diet, exercise, and weight loss. She denies chest pain. I discussed labs with the patient today.  2. Vitamin D deficiency Patricia Tran's Vit D level is below goal, and she notes fatigue. I discussed labs with the patient today.  3. Insulin resistance Patricia Tran has a new diagnosis of insulin resistance. Her glucose and A1c were with normal limits, but her fasting insulin is elevated. She notes polyphagia at times but no signs of hypoglycemia. I discussed labs with the patient today.  4. Thyroid nodule Patricia Tran has a history of a thyroid nodule. Thyroid labs did not yet resulted. She notes fatigue, but denies palpitations or tremors.  5. At risk for diabetes mellitus Patricia Tran is at higher than average risk for developing diabetes due to obesity.   Assessment/Plan:   1. Other hyperlipidemia Cardiovascular risk and specific lipid/LDL goals reviewed.  We discussed several lifestyle modifications today. Patricia Tran will continue to work on diet, exercise and weight loss efforts. We will recheck labs in 3 months. Orders and follow up as documented in patient record.   2. Vitamin D deficiency Low Vitamin D level contributes to fatigue  and are associated with obesity, breast, and colon cancer. Patricia Tran agreed to start prescription Vitamin D 50,000 IU every week with no refills. We will recheck labs in 3 months. She will follow-up for routine testing of Vitamin D, at least 2-3 times per year to avoid over-replacement.  - Vitamin D, Ergocalciferol, (DRISDOL) 1.25 MG (50000 UNIT) CAPS capsule; Take 1 capsule (50,000 Units total) by mouth every 7 (seven) days.  Dispense: 4 capsule; Refill: 0  3. Insulin resistance Patricia Tran will continue to work on diet, exercise, and decreasing simple carbohydrates to help decrease the risk of diabetes. She was educated on how various foods affect this and her weight. We will recheck labs in 3 months. Patricia Tran agreed to follow-up with [place] as directed to closely monitor her progress.  4. Thyroid nodule We will check labs today. Patricia Tran will continue to follow up as directed. Orders and follow up as documented in patient record.  - T3 - T4, free - TSH  5. At risk for diabetes mellitus Patricia Tran was given approximately 30 minutes of diabetes education and counseling today. We discussed intensive lifestyle modifications today with an emphasis on weight loss as well as increasing exercise and decreasing simple carbohydrates in her diet. We also reviewed medication options with an emphasis on risk versus benefit of those discussed.   Repetitive spaced learning was employed today to elicit superior memory formation and behavioral change.  6. Obesity with current BMI 32.3 Patricia Tran is currently in the action stage of change. As such, her goal  is to continue with weight loss efforts. She has agreed to the Category 2 Plan with lunch options.   Exercise goals: As is.  Behavioral modification strategies: increasing lean protein intake and decreasing simple carbohydrates.  Patricia Tran has agreed to follow-up with our clinic in 2 weeks. She was informed of the importance of frequent follow-up visits to maximize her success with intensive  lifestyle modifications for her multiple health conditions.   Patricia Tran was informed we would discuss her lab results at her next visit unless there is a critical issue that needs to be addressed sooner. Patricia Tran agreed to keep her next visit at the agreed upon time to discuss these results.  Objective:   Blood pressure 118/79, pulse (!) 111, temperature 98.1 F (36.7 C), height 5\' 3"  (1.6 m), weight 182 lb (82.6 kg), SpO2 97 %. Body mass index is 32.24 kg/m.  General: Cooperative, alert, well developed, in no acute distress. HEENT: Conjunctivae and lids unremarkable. Cardiovascular: Regular rhythm.  Lungs: Normal work of breathing. Neurologic: No focal deficits.   Lab Results  Component Value Date   CREATININE 0.82 08/11/2020   BUN 12 08/11/2020   NA 142 08/11/2020   K 4.4 08/11/2020   CL 105 08/11/2020   CO2 22 08/11/2020   Lab Results  Component Value Date   ALT 30 08/11/2020   AST 19 08/11/2020   ALKPHOS 89 08/11/2020   BILITOT 0.3 08/11/2020   Lab Results  Component Value Date   HGBA1C 5.4 08/11/2020   HGBA1C 5.4 10/01/2018   Lab Results  Component Value Date   INSULIN 10.2 08/11/2020   Lab Results  Component Value Date   TSH 1.480 08/25/2020   Lab Results  Component Value Date   CHOL 195 08/11/2020   HDL 42 08/11/2020   LDLCALC 130 (H) 08/11/2020   TRIG 127 08/11/2020   Lab Results  Component Value Date   WBC 6.6 08/11/2020   HGB 12.6 08/11/2020   HCT 39.9 08/11/2020   MCV 87 08/11/2020   PLT 331 10/01/2018   No results found for: IRON, TIBC, FERRITIN  Attestation Statements:   Reviewed by clinician on day of visit: allergies, medications, problem list, medical history, surgical history, family history, social history, and previous encounter notes.   I, Trixie Dredge, am acting as transcriptionist for Dennard Nip, MD.  I have reviewed the above documentation for accuracy and completeness, and I agree with the above. -  Dennard Nip, MD

## 2020-08-31 ENCOUNTER — Ambulatory Visit (INDEPENDENT_AMBULATORY_CARE_PROVIDER_SITE_OTHER): Payer: 59 | Admitting: Family Medicine

## 2020-08-31 ENCOUNTER — Other Ambulatory Visit (HOSPITAL_COMMUNITY): Payer: Self-pay

## 2020-08-31 ENCOUNTER — Encounter: Payer: Self-pay | Admitting: Family Medicine

## 2020-08-31 ENCOUNTER — Other Ambulatory Visit: Payer: Self-pay

## 2020-08-31 VITALS — BP 128/75 | HR 106 | Temp 97.6°F | Ht 63.0 in | Wt 184.0 lb

## 2020-08-31 DIAGNOSIS — E041 Nontoxic single thyroid nodule: Secondary | ICD-10-CM

## 2020-08-31 DIAGNOSIS — Z01419 Encounter for gynecological examination (general) (routine) without abnormal findings: Secondary | ICD-10-CM | POA: Diagnosis not present

## 2020-08-31 DIAGNOSIS — Z1159 Encounter for screening for other viral diseases: Secondary | ICD-10-CM | POA: Diagnosis not present

## 2020-08-31 DIAGNOSIS — E559 Vitamin D deficiency, unspecified: Secondary | ICD-10-CM | POA: Diagnosis not present

## 2020-08-31 DIAGNOSIS — D219 Benign neoplasm of connective and other soft tissue, unspecified: Secondary | ICD-10-CM

## 2020-08-31 DIAGNOSIS — Z0000 Encounter for general adult medical examination without abnormal findings: Secondary | ICD-10-CM | POA: Diagnosis not present

## 2020-08-31 DIAGNOSIS — F419 Anxiety disorder, unspecified: Secondary | ICD-10-CM

## 2020-08-31 DIAGNOSIS — Z23 Encounter for immunization: Secondary | ICD-10-CM | POA: Diagnosis not present

## 2020-08-31 DIAGNOSIS — Z1211 Encounter for screening for malignant neoplasm of colon: Secondary | ICD-10-CM | POA: Diagnosis not present

## 2020-08-31 DIAGNOSIS — F330 Major depressive disorder, recurrent, mild: Secondary | ICD-10-CM

## 2020-08-31 DIAGNOSIS — E7849 Other hyperlipidemia: Secondary | ICD-10-CM

## 2020-08-31 MED ORDER — LORAZEPAM 0.5 MG PO TABS
0.5000 mg | ORAL_TABLET | Freq: Every day | ORAL | 1 refills | Status: DC | PRN
  Filled 2020-08-31: qty 90, fill #0
  Filled 2020-10-21 – 2020-12-01 (×4): qty 90, 90d supply, fill #0
  Filled 2021-01-22: qty 90, 90d supply, fill #1

## 2020-08-31 MED ORDER — BUPROPION HCL ER (XL) 300 MG PO TB24
300.0000 mg | ORAL_TABLET | Freq: Every day | ORAL | 1 refills | Status: DC
  Filled 2020-08-31: qty 90, fill #0
  Filled 2020-10-21 – 2020-10-29 (×2): qty 90, 90d supply, fill #0
  Filled 2021-01-22: qty 90, 90d supply, fill #1

## 2020-08-31 NOTE — Patient Instructions (Signed)
Great to see you today.  I have refilled the medication(s) we provide.   If labs were collected, we will inform you of lab results once received either by echart message or telephone call.   - echart message- for normal results that have been seen by the patient already.   - telephone call: abnormal results or if patient has not viewed results in their echart.    Health Maintenance, Female Adopting a healthy lifestyle and getting preventive care are important in promoting health and wellness. Ask your health care provider about:  The right schedule for you to have regular tests and exams.  Things you can do on your own to prevent diseases and keep yourself healthy. What should I know about diet, weight, and exercise? Eat a healthy diet  Eat a diet that includes plenty of vegetables, fruits, low-fat dairy products, and lean protein.  Do not eat a lot of foods that are high in solid fats, added sugars, or sodium.   Maintain a healthy weight Body mass index (BMI) is used to identify weight problems. It estimates body fat based on height and weight. Your health care provider can help determine your BMI and help you achieve or maintain a healthy weight. Get regular exercise Get regular exercise. This is one of the most important things you can do for your health. Most adults should:  Exercise for at least 150 minutes each week. The exercise should increase your heart rate and make you sweat (moderate-intensity exercise).  Do strengthening exercises at least twice a week. This is in addition to the moderate-intensity exercise.  Spend less time sitting. Even light physical activity can be beneficial. Watch cholesterol and blood lipids Have your blood tested for lipids and cholesterol at 52 years of age, then have this test every 5 years. Have your cholesterol levels checked more often if:  Your lipid or cholesterol levels are high.  You are older than 52 years of age.  You are at  high risk for heart disease. What should I know about cancer screening? Depending on your health history and family history, you may need to have cancer screening at various ages. This may include screening for:  Breast cancer.  Cervical cancer.  Colorectal cancer.  Skin cancer.  Lung cancer. What should I know about heart disease, diabetes, and high blood pressure? Blood pressure and heart disease  High blood pressure causes heart disease and increases the risk of stroke. This is more likely to develop in people who have high blood pressure readings, are of African descent, or are overweight.  Have your blood pressure checked: ? Every 3-5 years if you are 80-47 years of age. ? Every year if you are 60 years old or older. Diabetes Have regular diabetes screenings. This checks your fasting blood sugar level. Have the screening done:  Once every three years after age 24 if you are at a normal weight and have a low risk for diabetes.  More often and at a younger age if you are overweight or have a high risk for diabetes. What should I know about preventing infection? Hepatitis B If you have a higher risk for hepatitis B, you should be screened for this virus. Talk with your health care provider to find out if you are at risk for hepatitis B infection. Hepatitis C Testing is recommended for:  Everyone born from [birth year] through 1967.  Anyone with known risk factors for hepatitis C. Sexually transmitted infections (STIs)  Get screened for  STIs, including gonorrhea and chlamydia, if: ? You are sexually active and are younger than 51 years of age. ? You are older than 52 years of age and your health care provider tells you that you are at risk for this type of infection. ? Your sexual activity has changed since you were last screened, and you are at increased risk for chlamydia or gonorrhea. Ask your health care provider if you are at risk.  Ask your health care provider about whether  you are at high risk for HIV. Your health care provider may recommend a prescription medicine to help prevent HIV infection. If you choose to take medicine to prevent HIV, you should first get tested for HIV. You should then be tested every 3 months for as long as you are taking the medicine. Pregnancy  If you are about to stop having your period (premenopausal) and you may become pregnant, seek counseling before you get pregnant.  Take 400 to 800 micrograms (mcg) of folic acid every day if you become pregnant.  Ask for birth control (contraception) if you want to prevent pregnancy. Osteoporosis and menopause Osteoporosis is a disease in which the bones lose minerals and strength with aging. This can result in bone fractures. If you are 54 years old or older, or if you are at risk for osteoporosis and fractures, ask your health care provider if you should:  Be screened for bone loss.  Take a calcium or vitamin D supplement to lower your risk of fractures.  Be given hormone replacement therapy (HRT) to treat symptoms of menopause. Follow these instructions at home: Lifestyle  Do not use any products that contain nicotine or tobacco, such as cigarettes, e-cigarettes, and chewing tobacco. If you need help quitting, ask your health care provider.  Do not use street drugs.  Do not share needles.  Ask your health care provider for help if you need support or information about quitting drugs. Alcohol use  Do not drink alcohol if: ? Your health care provider tells you not to drink. ? You are pregnant, may be pregnant, or are planning to become pregnant.  If you drink alcohol: ? Limit how much you use to 0-1 drink a day. ? Limit intake if you are breastfeeding.  Be aware of how much alcohol is in your drink. In the [place] one drink equals one 12 oz bottle of beer (355 mL), one 5 oz glass of wine (148 mL), or one 1 oz glass of hard liquor (44 mL). General instructions  Schedule regular  health, dental, and eye exams.  Stay current with your vaccines.  Tell your health care provider if: ? You often feel depressed. ? You have ever been abused or do not feel safe at home. Summary  Adopting a healthy lifestyle and getting preventive care are important in promoting health and wellness.  Follow your health care provider's instructions about healthy diet, exercising, and getting tested or screened for diseases.  Follow your health care provider's instructions on monitoring your cholesterol and blood pressure. This information is not intended to replace advice given to you by your health care provider. Make sure you discuss any questions you have with your health care provider. Document Revised: 03/21/2018 Document Reviewed: 03/21/2018 Elsevier Patient Education  2021 [employer].

## 2020-08-31 NOTE — Progress Notes (Signed)
This visit occurred during the SARS-CoV-2 public health emergency.  Safety protocols were in place, including screening questions prior to the visit, additional usage of staff PPE, and extensive cleaning of exam room while observing appropriate contact time as indicated for disinfecting solutions.    Patient ID: Patricia Tran, female  DOB: [birth year], 50 y.o.   MRN: [id number] Patient Care Team    Relationship Specialty Notifications Start End  Ma Hillock, DO PCP - General Family Medicine  05/11/20   Shamleffer, Melanie Crazier, MD Consulting Physician Endocrinology  08/31/20   Lindon Romp, MD Referring Physician General Surgery  08/31/20     Chief Complaint  Patient presents with  . Annual Exam    Pt is fasting    Subjective: Patricia Tran is a 50 y.o.  Female  present for CPE/CMC. All past medical history, surgical history, allergies, family history, immunizations, medications and social history were updated in the electronic medical record today. All recent labs, ED visits and hospitalizations within the last year were reviewed. Reviewed recent labs collected at weight loss clinic- Dr. Leafy Ro. CBC, CMP, TSH, A1c and lipids are normal.  B12 and vit d also in normal range.   Health maintenance:  Colonoscopy: no fhx. Never screened.> referred to Dr. Hilarie Fredrickson Mammogram: completed: 09/2019, >[facility] Cervical cancer screening: last pap: 12/2019,referred to gyn Dr. Monica Becton.  Patient's last menstrual period was 08/20/2020. G1P1 Immunizations: tdap administered today, Influenza UTD(encouraged yearly), zostavax declined, covid series completed Infectious disease screening: HIV completed with pregnancy, Hep C completed today DEXA: consider early screen Assistive device: none Oxygen EUM:PNTI Patient has a Dental home. 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 She has been prescribed Wellbutrin 300 mg daily and Ativan 0.5 mg daily as needed. She feels her symptoms are well controlled on these medications. She is going through a tough time with thyroid cancer   Thyroid nodule: Rpt [place] and bx where concerning for malignancy. Pt is scheduled to undergo left thyroidectomy next week with Dr. Kathi Simpers.  Prior note: Has had follow up US after biopsy-but no biopsy recently. Patient endorses having noticed enlargement over her left thyroid again and has concerns she needs to have another ultrasound and follow-up.   Depression screen [facility] 2/9 08/31/2020 08/11/2020  Decreased Interest 0 2  Down, Depressed, Hopeless 1 3  PHQ - 2 Score 1 5  Altered sleeping 2 0  Tired, decreased energy 1 0  Change in appetite 1 0  Feeling bad or failure about yourself  2 0  Trouble concentrating 0 0  Moving slowly or fidgety/restless 0 0  Suicidal thoughts 0 0  PHQ-9 Score 7 5  Difficult doing work/chores - Not difficult at all   GAD 7 : Generalized Anxiety Score 08/31/2020  Nervous, Anxious, on Edge 2  Control/stop worrying 2  Worry too much - different things 1  Trouble relaxing 0  Restless 0  Easily annoyed or irritable 1  Afraid - awful might happen 0  Total GAD 7 Score 6     Immunization History  Administered Date(s) Administered  . PFIZER Comirnaty(Gray Top)Covid-19 Tri-Sucrose Vaccine 06/05/2020  . PFIZER(Purple Top)SARS-COV-2 Vaccination 11/07/2019, 11/28/2019  . Tdap 08/31/2020     Past Medical History:  Diagnosis Date  . Allergy   . Anemia   . Anxiety   . Asthma   . Constipation   . Depression   . Diverticulitis   .  Fibroids 2017   left sided uterine leiomyoma   . Gallbladder disease   . GERD (gastroesophageal reflux disease)   . Goiter   . Hyperlipidemia   . Joint pain   . Kidney stone 08/31/2016   incidental find on CT right renal calculus  . Lactose intolerance   . Lump in neck   . Other fatigue   .  Ovarian cyst 08/31/2016   1.5 cm left ovarian cyst  . SOB (shortness of breath) on exertion   . Swallowing difficulty   . Thyroid nodule 03/2013   2.6x1.9x1.8 cm on last US thyroid 02/2015- "stable".  lg mixed schogenic nodule left thyroid. "Follicular lesion of unknown significance"   Allergies  Allergen Reactions  . Codeine Itching   Past Surgical History:  Procedure Laterality Date  . BIOPSY THYROID  06/2020  . CHOLECYSTECTOMY  2011  . TRANSAXILLERY ENDOSCOPIC LEFT THYROID LOBECTOMY WITH ROBOTIC ASSISTANCE Left 08/2020   scheduled end of May 2022.nodule- high concern for malignancy   Family History  Problem Relation Age of Onset  . Early death Mother   . Kidney disease Mother   . Alcohol abuse Father   . Cancer Father        oral HPV  . Alcoholism Father   . Obesity Father   . Colitis Father   . Colon polyps Father   . Diabetes Maternal Grandmother   . Alcohol abuse Maternal Grandfather   . Heart disease Maternal Grandfather   . Heart attack Paternal Grandmother   . Heart attack Paternal Grandfather    Social History   Social History Narrative   Marital status/children/pets: married, 1 child   Education/employment: BA. Building surveyor.    Safety:      -smoke alarm in the home:Yes     - wears seatbelt: Yes     - Feels safe in their relationships: Yes    Allergies as of 08/31/2020      Reactions   Codeine Itching      Medication List       Accurate as of Aug 31, 2020  9:50 AM. If you have any questions, ask your nurse or doctor.        buPROPion 300 MG 24 hr tablet Commonly known as: WELLBUTRIN XL Take 1 tablet (300 mg total) by mouth daily. What changed: how much to take Changed by: Howard Pouch, DO   LORazepam 0.5 MG tablet Commonly known as: ATIVAN Take 1 tablet (0.5 mg total) by mouth daily as needed for anxiety. What changed:   how much to take  how to take this  when to take this  reasons to take this Changed by: Howard Pouch,  DO   Vitamin D (Ergocalciferol) 1.25 MG (50000 UNIT) Caps capsule Commonly known as: DRISDOL Take 1 capsule (50,000 Units total) by mouth every 7 (seven) days.       All past medical history, surgical history, allergies, family history, immunizations andmedications were updated in the EMR today and reviewed under the history and medication portions of their EMR.      US Thyroid Biopsy Result Date: 06/15/2020 IMPRESSION: Technically successful ultrasound guided fine needle aspiration of indeterminate TR4 nodule within the mid aspect of the left lobe of the thyroid (labeled #2). Electronically Signed   By: Sandi Mariscal M.D.   On: 06/15/2020 14:10    ROS: 14 pt review of systems performed and negative (unless mentioned in an HPI)  Objective: BP 128/75   Pulse (!) 106  Temp 97.6 F (36.4 C) (Oral)   Ht 5\' 3"  (1.6 m)   Wt 184 lb (83.5 kg)   LMP 08/20/2020   SpO2 99%   BMI 32.59 kg/m  Gen: Afebrile. No acute distress. Nontoxic in appearance, well-developed, well-nourished,  Obese very pleasant female.  HENT: AT. [place]. Bilateral TM visualized and normal in appearance, normal external auditory canal. MMM, no oral lesions, adequate dentition. Bilateral nares within normal limits. Throat without erythema, ulcerations or exudates. no Cough on exam, no hoarseness on exam. Eyes:Pupils Equal Round Reactive to light, Extraocular movements intact,  Conjunctiva without redness, discharge or icterus. Neck/lymp/endocrine: Supple,no lymphadenopathy,  Thyromegaly present CV: RRR no murmur, no edema, +2/4 P posterior tibialis pulses.  Chest: CTAB, no wheeze, rhonchi or crackles. normal Respiratory effort. good Air movement. Abd: Soft. flat. Mild TTP LLQ to deep palpation, ND. BS present. no Masses palpated. No hepatosplenomegaly. No rebound tenderness or guarding. Skin: no rashes, purpura or petechiae. Warm and well-perfused. Skin intact. Neuro/Msk:  Normal gait. PERLA. EOMi. Alert. Oriented x3.  Cranial  nerves II through XII intact. Muscle strength 5/5 upper/lower extremity. DTRs equal bilaterally. Psych: Normal affect, dress and demeanor. Normal speech. Normal thought content and judgment.  No exam data present  Assessment/plan: Summers Miura is a 50 y.o. female present for [place] Anxiety/Mild episode of recurrent major depressive disorder ([place]) Stable.  Continue Wellbutrin 300 mg daily Continue  Ativan 0.5 mg daily as needed [place] controlled substance database reviewed 08/31/20 Follow-up 5.5 months. Patient is aware will need face-to-face visit prior to any refills of controlled substances.  Thyroid nodule Scheduled next week for left thyroidectomy for nodule concerning for malignancy.   Routine general medical examination at a health care facility Colonoscopy: no fhx. Never screened.> referred to Dr. Hilarie Fredrickson Mammogram: completed: 09/2019, >[facility]uled [facility] Cervical cancer screening: last pap: 12/2019,referred to gyn. Immunizations: tdap administered today, Influenza UTD(encouraged yearly), zostavax declined, covid series completed Infectious disease screening: HIV completed with pregnancy, Hep C completed today DEXA: consider early screen Patient was encouraged to exercise greater than 150 minutes a week. Patient was encouraged to choose a diet filled with fresh fruits and vegetables, and lean meats. AVS provided to patient today for education/recommendation on gender specific health and safety maintenance.   hyperlipidemia Labs UTD continue diet and exercise.   Vitamin D deficiency Normal by labs 08/2020- continue supplement  Need for Tdap vaccination tdap adminstered  Colon cancer screening/intermittent LLQ discomfort.  - Ambulatory referral to Gastroenterology  Need for hepatitis C screening test - Hepatitis C Antibody  Visit for pelvic exam/Fibroid - she may desire referral to surgeon in [facility] system- Dr. Cristal Deer (will need full name) -  Ambulatory referral to Gynecology    Return in about 6 months (around 02/17/2021) for [place] (30 min) and 1 yr cpe.   Orders Placed This Encounter  Procedures  . Tdap vaccine greater than or equal to 7yo IM  . Hepatitis C Antibody  . Ambulatory referral to Gastroenterology  . Ambulatory referral to Gynecology   Meds ordered this encounter  Medications  . buPROPion (WELLBUTRIN XL) 300 MG 24 hr tablet    Sig: Take 1 tablet (300 mg total) by mouth daily.    Dispense:  90 tablet    Refill:  1  . LORazepam (ATIVAN) 0.5 MG tablet    Sig: Take 1 tablet (0.5 mg total) by mouth daily as needed for anxiety.    Dispense:  90 tablet    Refill:  1  Referral Orders     Ambulatory referral to Gastroenterology     Ambulatory referral to Gynecology   Electronically signed by: Howard Pouch, DO [place]

## 2020-09-01 ENCOUNTER — Encounter: Payer: Self-pay | Admitting: Family Medicine

## 2020-09-01 LAB — HEPATITIS C ANTIBODY
Hepatitis C Ab: NONREACTIVE
SIGNAL TO CUT-OFF: 0.01 (ref <1.00)

## 2020-09-03 ENCOUNTER — Other Ambulatory Visit: Payer: Self-pay | Admitting: Family Medicine

## 2020-09-03 DIAGNOSIS — Z1231 Encounter for screening mammogram for malignant neoplasm of breast: Secondary | ICD-10-CM

## 2020-09-08 ENCOUNTER — Encounter: Payer: Self-pay | Admitting: Internal Medicine

## 2020-09-08 ENCOUNTER — Other Ambulatory Visit: Payer: Self-pay

## 2020-09-08 ENCOUNTER — Ambulatory Visit
Admission: RE | Admit: 2020-09-08 | Discharge: 2020-09-08 | Disposition: A | Discharge status: Home / Self Care | Payer: 59 | Source: Ambulatory Visit

## 2020-09-08 DIAGNOSIS — Z1231 Encounter for screening mammogram for malignant neoplasm of breast: Secondary | ICD-10-CM | POA: Diagnosis not present

## 2020-09-10 DIAGNOSIS — Z9009 Acquired absence of other part of head and neck: Secondary | ICD-10-CM | POA: Insufficient documentation

## 2020-09-10 DIAGNOSIS — C73 Malignant neoplasm of thyroid gland: Secondary | ICD-10-CM | POA: Diagnosis not present

## 2020-09-10 DIAGNOSIS — Z20822 Contact with and (suspected) exposure to covid-19: Secondary | ICD-10-CM | POA: Diagnosis not present

## 2020-09-10 DIAGNOSIS — E041 Nontoxic single thyroid nodule: Secondary | ICD-10-CM | POA: Diagnosis not present

## 2020-09-10 DIAGNOSIS — Z79899 Other long term (current) drug therapy: Secondary | ICD-10-CM | POA: Diagnosis not present

## 2020-09-11 ENCOUNTER — Other Ambulatory Visit (HOSPITAL_BASED_OUTPATIENT_CLINIC_OR_DEPARTMENT_OTHER): Payer: Self-pay

## 2020-09-11 MED ORDER — TRAMADOL HCL 50 MG PO TABS
ORAL_TABLET | ORAL | 0 refills | Status: DC
  Filled 2020-09-11: qty 30, 5d supply, fill #0

## 2020-09-15 ENCOUNTER — Other Ambulatory Visit: Payer: Self-pay

## 2020-09-15 ENCOUNTER — Ambulatory Visit (INDEPENDENT_AMBULATORY_CARE_PROVIDER_SITE_OTHER): Payer: 59 | Admitting: Adult Health

## 2020-09-15 ENCOUNTER — Encounter (INDEPENDENT_AMBULATORY_CARE_PROVIDER_SITE_OTHER): Payer: Self-pay | Admitting: Adult Health

## 2020-09-15 VITALS — BP 121/73 | HR 97 | Temp 98.6°F | Ht 63.0 in | Wt 182.6 lb

## 2020-09-15 DIAGNOSIS — E041 Nontoxic single thyroid nodule: Secondary | ICD-10-CM

## 2020-09-15 DIAGNOSIS — E669 Obesity, unspecified: Secondary | ICD-10-CM | POA: Diagnosis not present

## 2020-09-15 DIAGNOSIS — E66811 Obesity, class 1: Secondary | ICD-10-CM | POA: Insufficient documentation

## 2020-09-15 DIAGNOSIS — E8881 Metabolic syndrome: Secondary | ICD-10-CM | POA: Diagnosis not present

## 2020-09-15 DIAGNOSIS — E7800 Pure hypercholesterolemia, unspecified: Secondary | ICD-10-CM | POA: Diagnosis not present

## 2020-09-15 DIAGNOSIS — Z6832 Body mass index (BMI) 32.0-32.9, adult: Secondary | ICD-10-CM | POA: Diagnosis not present

## 2020-09-21 NOTE — Progress Notes (Signed)
Chief Complaint:   OBESITY Patricia Tran is here to discuss her progress with her obesity treatment plan along with follow-up of her obesity related diagnoses. Britny is on the Category 2 Plan and states she is following her eating plan approximately 50% of the time. Irmgard states she is not currently exercising.   Today's visit was #: 3 Starting weight: 185 lbs Starting date: 08/11/2020 Today's weight: 182 lbs Today's date: 09/15/2020 Total lbs lost to date: 3 Total lbs lost since last in-office visit: 0  Interim History: Patricia Tran recently underwent transaxillary endoscopic left thyroid lobectomy to remove Left thyroid nodule. She is able to swallow and speak with minimal discomfort. Her last dose of tramadol was over 48 hours ago.  Subjective:   1. Pure hypercholesterolemia 08/11/2020 lipid panel- elevated LDL of 130.  Patricia Tran is not on statin therapy. Her mother passed at age 52 from complications of renal function and CVA.  Lab Results  Component Value Date   ALT 30 08/11/2020   AST 19 08/11/2020   ALKPHOS 89 08/11/2020   BILITOT 0.3 08/11/2020   Lab Results  Component Value Date   CHOL 195 08/11/2020   HDL 42 08/11/2020   LDLCALC 130 (H) 08/11/2020   TRIG 127 08/11/2020    2. Insulin resistance 08/11/2020 BG 80, A1c 5.4. and insulin level 10.2.  Lab Results  Component Value Date   INSULIN 10.2 08/11/2020   Lab Results  Component Value Date   HGBA1C 5.4 08/11/2020    3. Nodule of left lobe of thyroid gland Patricia Tran recently underwent transaxillary endoscopic left thyroid lobectomy to remove Left thyroid nodule. She is able to swallow and speak with minimal discomfort. Her last dose of tramadol was over 48 hours ago.  Assessment/Plan:   1. Pure hypercholesterolemia Cardiovascular risk and specific lipid/LDL goals reviewed.  We discussed several lifestyle modifications today and [name] will continue to work on diet, exercise and weight loss efforts. Orders and follow up as documented  in patient record.  -Decrease saturated fat -Monitor labs  Counseling Intensive lifestyle modifications are the first line treatment for this issue. Dietary changes: Increase soluble fiber. Decrease simple carbohydrates. Exercise changes: Moderate to vigorous-intensity aerobic activity 150 minutes per week if tolerated. Lipid-lowering medications: see documented in medical record.   2. Insulin resistance Patricia Tran will continue to work on weight loss, exercise, and decreasing simple carbohydrates to help decrease the risk of diabetes. Patricia Tran agreed to follow-up with Patricia Tran as directed to closely monitor her progress. -Increase protein and decrease sugar/carbs.  3. Nodule of left lobe of thyroid gland Advance diet as tolerated. Follow up with surgeon as directed.  4. Obesity with current BMI 32.3  Patricia Tran is currently in the action stage of change. As such, her goal is to continue with weight loss efforts. She has agreed to the Category 2 Plan- advance as tolerated.  Handout: Protein Shake, Additional Breakfast Options   Exercise goals: No exercise has been prescribed at this time.  Behavioral modification strategies: increasing lean protein intake, decreasing simple carbohydrates, meal planning and cooking strategies, keeping healthy foods in the home, better snacking choices, and planning for success.  Patricia Tran has agreed to follow-up with our clinic in 2 weeks. She was informed of the importance of frequent follow-up visits to maximize her success with intensive lifestyle modifications for her multiple health conditions.   Objective:   Blood pressure 121/73, pulse 97, temperature 98.6 F (37 C), height 5\' 3"  (1.6 m), last menstrual period 08/20/2020, SpO2  96 %. Body mass index is 32.59 kg/m.  General: Cooperative, alert, well developed, in no acute distress. HEENT: Conjunctivae and lids unremarkable. Cardiovascular: Regular rhythm.  Lungs: Normal work of breathing. Neurologic: No focal  deficits.   Lab Results  Component Value Date   CREATININE 0.82 08/11/2020   BUN 12 08/11/2020   NA 142 08/11/2020   K 4.4 08/11/2020   CL 105 08/11/2020   CO2 22 08/11/2020   Lab Results  Component Value Date   ALT 30 08/11/2020   AST 19 08/11/2020   ALKPHOS 89 08/11/2020   BILITOT 0.3 08/11/2020   Lab Results  Component Value Date   HGBA1C 5.4 08/11/2020   HGBA1C 5.4 10/01/2018   Lab Results  Component Value Date   INSULIN 10.2 08/11/2020   Lab Results  Component Value Date   TSH 1.480 08/25/2020   Lab Results  Component Value Date   CHOL 195 08/11/2020   HDL 42 08/11/2020   LDLCALC 130 (H) 08/11/2020   TRIG 127 08/11/2020   Lab Results  Component Value Date   WBC 6.6 08/11/2020   HGB 12.6 08/11/2020   HCT 39.9 08/11/2020   MCV 87 08/11/2020   PLT 331 10/01/2018   No results found for: IRON, TIBC, FERRITIN   Attestation Statements:   Reviewed by clinician on day of visit: allergies, medications, problem list, medical history, surgical history, family history, social history, and previous encounter notes.  Time spent on visit including pre-visit chart review and post-visit care and charting was 32 minutes.   Coral Ceo, CMA, am acting as transcriptionist for Mina Marble, NP.  I have reviewed the above documentation for accuracy and completeness, and I agree with the above. -  Emerson Barretto d. Zigmund Linse, NP-C

## 2020-09-22 DIAGNOSIS — E041 Nontoxic single thyroid nodule: Secondary | ICD-10-CM | POA: Diagnosis not present

## 2020-09-22 DIAGNOSIS — E042 Nontoxic multinodular goiter: Secondary | ICD-10-CM | POA: Diagnosis not present

## 2020-09-22 DIAGNOSIS — C73 Malignant neoplasm of thyroid gland: Secondary | ICD-10-CM | POA: Insufficient documentation

## 2020-09-22 DIAGNOSIS — E890 Postprocedural hypothyroidism: Secondary | ICD-10-CM | POA: Insufficient documentation

## 2020-09-28 ENCOUNTER — Other Ambulatory Visit: Payer: Self-pay

## 2020-09-28 ENCOUNTER — Ambulatory Visit (INDEPENDENT_AMBULATORY_CARE_PROVIDER_SITE_OTHER): Payer: 59 | Admitting: Family Medicine

## 2020-09-28 ENCOUNTER — Other Ambulatory Visit (HOSPITAL_COMMUNITY): Payer: Self-pay

## 2020-09-28 ENCOUNTER — Encounter (INDEPENDENT_AMBULATORY_CARE_PROVIDER_SITE_OTHER): Payer: Self-pay | Admitting: Family Medicine

## 2020-09-28 VITALS — BP 133/82 | HR 89 | Temp 98.3°F | Ht 63.0 in | Wt 180.0 lb

## 2020-09-28 DIAGNOSIS — Z6832 Body mass index (BMI) 32.0-32.9, adult: Secondary | ICD-10-CM | POA: Diagnosis not present

## 2020-09-28 DIAGNOSIS — Z9189 Other specified personal risk factors, not elsewhere classified: Secondary | ICD-10-CM | POA: Diagnosis not present

## 2020-09-28 DIAGNOSIS — E559 Vitamin D deficiency, unspecified: Secondary | ICD-10-CM | POA: Diagnosis not present

## 2020-09-28 DIAGNOSIS — E669 Obesity, unspecified: Secondary | ICD-10-CM | POA: Diagnosis not present

## 2020-09-28 MED ORDER — VITAMIN D (ERGOCALCIFEROL) 1.25 MG (50000 UNIT) PO CAPS
50000.0000 [IU] | ORAL_CAPSULE | ORAL | 0 refills | Status: DC
  Filled 2020-09-28: qty 4, 28d supply, fill #0

## 2020-09-29 NOTE — Progress Notes (Signed)
Chief Complaint:   OBESITY Patricia Tran is here to discuss her progress with her obesity treatment plan along with follow-up of her obesity related diagnoses. Patricia Tran is on the Category 2 Plan and states she is following her eating plan approximately 60% of the time. Patricia Tran states she is doing 0 minutes 0 times per week.  Today's visit was #: 4 Starting weight: 185 lbs Starting date: 08/11/2020 Today's weight: 180 lbs Today's date: 09/28/2020 Total lbs lost to date: 5 Total lbs lost since last in-office visit: 2  Interim History: Aesha continues to do well with weight loss on her Category 2 plan. She did some social eating in the evenings, but remained mindful of her choices. Her hunger is mostly controlled.  Subjective:   1. Vitamin D deficiency Patricia Tran is stable on Vit D, and she denies nausea or vomiting. Her Vit D level is not yet at goal  2. At risk for impaired metabolic function Patricia Tran is at increased risk for impaired metabolic function due to current nutrition and muscle mass.  Assessment/Plan:   1. Vitamin D deficiency Low Vitamin D level contributes to fatigue and are associated with obesity, breast, and colon cancer. We will refill prescription Vitamin D for 1 month. Amandajo will follow-up for routine testing of Vitamin D, at least 2-3 times per year to avoid over-replacement.  - Vitamin D, Ergocalciferol, (DRISDOL) 1.25 MG (50000 UNIT) CAPS capsule; Take 1 capsule (50,000 Units total) by mouth every 7 (seven) days.  Dispense: 4 capsule; Refill: 0  2. At risk for impaired metabolic function Patricia Tran was given approximately 15 minutes of impaired  metabolic function prevention counseling today. We discussed intensive lifestyle modifications today with an emphasis on specific nutrition and exercise instructions and strategies.   Repetitive spaced learning was employed today to elicit superior memory formation and behavioral change.  3. Obesity with current BMI 32 Patricia Tran is currently in the  action stage of change. As such, her goal is to continue with weight loss efforts. She has agreed to the Category 2 Plan.   Behavioral modification strategies: increasing lean protein intake.  Patricia Tran has agreed to follow-up with our clinic in 2 to 3 weeks. She was informed of the importance of frequent follow-up visits to maximize her success with intensive lifestyle modifications for her multiple health conditions.   Objective:   Blood pressure 133/82, pulse 89, temperature 98.3 F (36.8 C), height 5\' 3"  (1.6 m), weight 180 lb (81.6 kg), last menstrual period 09/18/2020, SpO2 95 %. Body mass index is 31.89 kg/m.  General: Cooperative, alert, well developed, in no acute distress. HEENT: Conjunctivae and lids unremarkable. Cardiovascular: Regular rhythm.  Lungs: Normal work of breathing. Neurologic: No focal deficits.   Lab Results  Component Value Date   CREATININE 0.82 08/11/2020   BUN 12 08/11/2020   NA 142 08/11/2020   K 4.4 08/11/2020   CL 105 08/11/2020   CO2 22 08/11/2020   Lab Results  Component Value Date   ALT 30 08/11/2020   AST 19 08/11/2020   ALKPHOS 89 08/11/2020   BILITOT 0.3 08/11/2020   Lab Results  Component Value Date   HGBA1C 5.4 08/11/2020   HGBA1C 5.4 10/01/2018   Lab Results  Component Value Date   INSULIN 10.2 08/11/2020   Lab Results  Component Value Date   TSH 1.480 08/25/2020   Lab Results  Component Value Date   CHOL 195 08/11/2020   HDL 42 08/11/2020   LDLCALC 130 (H) 08/11/2020  TRIG 127 08/11/2020   Lab Results  Component Value Date   WBC 6.6 08/11/2020   HGB 12.6 08/11/2020   HCT 39.9 08/11/2020   MCV 87 08/11/2020   PLT 331 10/01/2018   No results found for: IRON, TIBC, FERRITIN  Attestation Statements:   Reviewed by clinician on day of visit: allergies, medications, problem list, medical history, surgical history, family history, social history, and previous encounter notes.   I, Trixie Dredge, am acting as  transcriptionist for Dennard Nip, MD.  I have reviewed the above documentation for accuracy and completeness, and I agree with the above. -  Dennard Nip, MD

## 2020-09-30 DIAGNOSIS — D250 Submucous leiomyoma of uterus: Secondary | ICD-10-CM | POA: Diagnosis not present

## 2020-09-30 DIAGNOSIS — Z1231 Encounter for screening mammogram for malignant neoplasm of breast: Secondary | ICD-10-CM

## 2020-09-30 DIAGNOSIS — D251 Intramural leiomyoma of uterus: Secondary | ICD-10-CM | POA: Diagnosis not present

## 2020-09-30 DIAGNOSIS — D259 Leiomyoma of uterus, unspecified: Secondary | ICD-10-CM | POA: Diagnosis not present

## 2020-10-13 DIAGNOSIS — D219 Benign neoplasm of connective and other soft tissue, unspecified: Secondary | ICD-10-CM | POA: Diagnosis not present

## 2020-10-13 DIAGNOSIS — C73 Malignant neoplasm of thyroid gland: Secondary | ICD-10-CM | POA: Diagnosis not present

## 2020-10-15 ENCOUNTER — Other Ambulatory Visit (HOSPITAL_COMMUNITY): Payer: Self-pay

## 2020-10-15 ENCOUNTER — Other Ambulatory Visit: Payer: Self-pay

## 2020-10-15 ENCOUNTER — Encounter (INDEPENDENT_AMBULATORY_CARE_PROVIDER_SITE_OTHER): Payer: Self-pay | Admitting: Family Medicine

## 2020-10-15 ENCOUNTER — Ambulatory Visit (INDEPENDENT_AMBULATORY_CARE_PROVIDER_SITE_OTHER): Payer: 59 | Admitting: Family Medicine

## 2020-10-15 VITALS — BP 116/76 | HR 91 | Temp 97.9°F | Ht 63.0 in | Wt 181.0 lb

## 2020-10-15 DIAGNOSIS — E559 Vitamin D deficiency, unspecified: Secondary | ICD-10-CM

## 2020-10-15 DIAGNOSIS — Z6832 Body mass index (BMI) 32.0-32.9, adult: Secondary | ICD-10-CM

## 2020-10-15 DIAGNOSIS — Z9189 Other specified personal risk factors, not elsewhere classified: Secondary | ICD-10-CM

## 2020-10-15 DIAGNOSIS — E669 Obesity, unspecified: Secondary | ICD-10-CM | POA: Diagnosis not present

## 2020-10-15 MED ORDER — VITAMIN D (ERGOCALCIFEROL) 1.25 MG (50000 UNIT) PO CAPS
50000.0000 [IU] | ORAL_CAPSULE | ORAL | 0 refills | Status: DC
  Filled 2020-10-15 – 2020-10-19 (×2): qty 4, 28d supply, fill #0

## 2020-10-15 MED ORDER — LEVOTHYROXINE SODIUM 50 MCG PO TABS
50.0000 ug | ORAL_TABLET | Freq: Every day | ORAL | 11 refills | Status: DC
  Filled 2020-10-15 (×2): qty 30, 30d supply, fill #0
  Filled 2020-10-29 – 2020-10-30 (×3): qty 30, 30d supply, fill #1

## 2020-10-19 ENCOUNTER — Other Ambulatory Visit (HOSPITAL_COMMUNITY): Payer: Self-pay

## 2020-10-19 ENCOUNTER — Encounter: Payer: Self-pay | Admitting: Family Medicine

## 2020-10-19 ENCOUNTER — Encounter: Payer: 59 | Admitting: Family Medicine

## 2020-10-20 ENCOUNTER — Other Ambulatory Visit (HOSPITAL_COMMUNITY): Payer: Self-pay

## 2020-10-20 DIAGNOSIS — Z6832 Body mass index (BMI) 32.0-32.9, adult: Secondary | ICD-10-CM | POA: Diagnosis not present

## 2020-10-20 DIAGNOSIS — Z01419 Encounter for gynecological examination (general) (routine) without abnormal findings: Secondary | ICD-10-CM | POA: Diagnosis not present

## 2020-10-20 LAB — HM PAP SMEAR

## 2020-10-21 ENCOUNTER — Other Ambulatory Visit (HOSPITAL_COMMUNITY): Payer: Self-pay

## 2020-10-22 NOTE — Progress Notes (Signed)
Chief Complaint:   OBESITY Patricia Tran is here to discuss her progress with her obesity treatment plan along with follow-up of her obesity related diagnoses. Patricia Tran is on the Category 2 Plan and states she is following her eating plan approximately 60% of the time. Patricia Tran states she just started with a trainer for 30 minutes 3 times per week.  Today's visit was #: 5 Starting weight: 185 lbs Starting date: 08/11/2020 Today's weight: 181 lbs Today's date: 10/15/2020 Total lbs lost to date: 4 Total lbs lost since last in-office visit: 0  Interim History: Patricia Tran is working on diet and adding exercise to her routine. Her husband is recovering from surgery and that has gotten her off of her routine, but she is working on getting back on track.  Subjective:   1. Vitamin D deficiency Patricia Tran is stable on Vit D, but her level is not yet at goal.  2. At risk for dehydration Patricia Tran is at risk for dehydration due to increased exercise and heat.  Assessment/Plan:   1. Vitamin D deficiency Low Vitamin D level contributes to fatigue and are associated with obesity, breast, and colon cancer. We will refill prescription Vitamin D for 1 month. Patricia Tran will follow-up for routine testing of Vitamin D, at least 2-3 times per year to avoid over-replacement.  - Vitamin D, Ergocalciferol, (DRISDOL) 1.25 MG (50000 UNIT) CAPS capsule; Take 1 capsule (50,000 Units total) by mouth every 7 (seven) days.  Dispense: 4 capsule; Refill: 0  2. At risk for dehydration Patricia Tran was given approximately 15 minutes dehydration prevention counseling today. Patricia Tran is at risk for dehydration due to weight loss and current medication(s). She was encouraged to hydrate and monitor fluid status to avoid dehydration as well as weight loss plateaus.   3. Obesity with current BMI 32.1 Patricia Tran is currently in the action stage of change. As such, her goal is to continue with weight loss efforts. She has agreed to the Category 2 Plan.   Exercise goals: As  is, but she is to decrease intensity of workout slightly at least in the beginning.   Behavioral modification strategies: increasing lean protein intake and increasing water intake.  Patricia Tran has agreed to follow-up with our clinic in 2 to 3 weeks. She was informed of the importance of frequent follow-up visits to maximize her success with intensive lifestyle modifications for her multiple health conditions.   Objective:   Blood pressure 116/76, pulse 91, temperature 97.9 F (36.6 C), height 5\' 3"  (1.6 m), weight 181 lb (82.1 kg), last menstrual period 09/18/2020, SpO2 99 %. Body mass index is 32.06 kg/m.  General: Cooperative, alert, well developed, in no acute distress. HEENT: Conjunctivae and lids unremarkable. Cardiovascular: Regular rhythm.  Lungs: Normal work of breathing. Neurologic: No focal deficits.   Lab Results  Component Value Date   CREATININE 0.82 08/11/2020   BUN 12 08/11/2020   NA 142 08/11/2020   K 4.4 08/11/2020   CL 105 08/11/2020   CO2 22 08/11/2020   Lab Results  Component Value Date   ALT 30 08/11/2020   AST 19 08/11/2020   ALKPHOS 89 08/11/2020   BILITOT 0.3 08/11/2020   Lab Results  Component Value Date   HGBA1C 5.4 08/11/2020   HGBA1C 5.4 10/01/2018   Lab Results  Component Value Date   INSULIN 10.2 08/11/2020   Lab Results  Component Value Date   TSH 1.480 08/25/2020   Lab Results  Component Value Date   CHOL 195 08/11/2020  HDL 42 08/11/2020   LDLCALC 130 (H) 08/11/2020   TRIG 127 08/11/2020   Lab Results  Component Value Date   VD25OH 33.7 08/11/2020   Lab Results  Component Value Date   WBC 6.6 08/11/2020   HGB 12.6 08/11/2020   HCT 39.9 08/11/2020   MCV 87 08/11/2020   PLT 331 10/01/2018   No results found for: IRON, TIBC, FERRITIN  Attestation Statements:   Reviewed by clinician on day of visit: allergies, medications, problem list, medical history, surgical history, family history, social history, and previous  encounter notes.   I, Trixie Dredge, am acting as transcriptionist for Dennard Nip, MD.  I have reviewed the above documentation for accuracy and completeness, and I agree with the above. -  Dennard Nip, MD

## 2020-10-23 ENCOUNTER — Telehealth: Payer: Self-pay | Admitting: *Deleted

## 2020-10-23 NOTE — Telephone Encounter (Signed)
John,  Can you review the intubation note 09-10-2020, [facility]. 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 As noted below- Pyrtle MD thx marie   ETT 09/10/20; 1523 (created via procedure documentation); Blade size: 3; Video laryngoscopic view grade: 1; Airway size (mm): 7.0; Cuffed; Insertion attempts: 1; Insertion site: oral; Placement verification: Bilateral Breath Sounds, ETCO2; Secured at 21 cm; Measured from Teeth; Complex? No; Mask airway: Easy; Video-assisted blade: Glidescope 09/10/20 1523 by Cathren Laine, MD 09/10/20 1722 by Clint Lipps, CRNA

## 2020-10-26 NOTE — Telephone Encounter (Signed)
Noted thanks °

## 2020-10-27 ENCOUNTER — Encounter (INDEPENDENT_AMBULATORY_CARE_PROVIDER_SITE_OTHER): Payer: Self-pay | Admitting: Family Medicine

## 2020-10-28 ENCOUNTER — Other Ambulatory Visit (HOSPITAL_COMMUNITY): Payer: Self-pay

## 2020-10-28 ENCOUNTER — Ambulatory Visit: Payer: 59 | Admitting: Internal Medicine

## 2020-10-29 ENCOUNTER — Encounter (INDEPENDENT_AMBULATORY_CARE_PROVIDER_SITE_OTHER): Payer: Self-pay | Admitting: Family Medicine

## 2020-10-29 ENCOUNTER — Other Ambulatory Visit (HOSPITAL_COMMUNITY): Payer: Self-pay

## 2020-10-29 ENCOUNTER — Ambulatory Visit (INDEPENDENT_AMBULATORY_CARE_PROVIDER_SITE_OTHER): Payer: 59 | Admitting: Family Medicine

## 2020-10-29 ENCOUNTER — Other Ambulatory Visit: Payer: Self-pay

## 2020-10-29 VITALS — BP 112/76 | HR 99 | Temp 98.0°F | Ht 63.0 in | Wt 180.0 lb

## 2020-10-29 DIAGNOSIS — E669 Obesity, unspecified: Secondary | ICD-10-CM

## 2020-10-29 DIAGNOSIS — Z9189 Other specified personal risk factors, not elsewhere classified: Secondary | ICD-10-CM

## 2020-10-29 DIAGNOSIS — E66811 Obesity, class 1: Secondary | ICD-10-CM

## 2020-10-29 DIAGNOSIS — Z6832 Body mass index (BMI) 32.0-32.9, adult: Secondary | ICD-10-CM | POA: Diagnosis not present

## 2020-10-29 DIAGNOSIS — E559 Vitamin D deficiency, unspecified: Secondary | ICD-10-CM | POA: Diagnosis not present

## 2020-10-29 MED ORDER — VITAMIN D (ERGOCALCIFEROL) 1.25 MG (50000 UNIT) PO CAPS
50000.0000 [IU] | ORAL_CAPSULE | ORAL | 0 refills | Status: DC
  Filled 2020-10-29: qty 4, 28d supply, fill #0

## 2020-10-29 MED ORDER — LEVOTHYROXINE SODIUM 50 MCG PO TABS
50.0000 ug | ORAL_TABLET | Freq: Every day | ORAL | 11 refills | Status: DC
  Filled 2020-10-29 – 2020-10-30 (×2): qty 30, 30d supply, fill #0
  Filled 2020-12-08: qty 30, 30d supply, fill #1
  Filled 2021-01-08: qty 30, 30d supply, fill #2

## 2020-10-30 ENCOUNTER — Other Ambulatory Visit (HOSPITAL_COMMUNITY): Payer: Self-pay

## 2020-11-02 ENCOUNTER — Other Ambulatory Visit (HOSPITAL_COMMUNITY): Payer: Self-pay

## 2020-11-04 ENCOUNTER — Other Ambulatory Visit (HOSPITAL_COMMUNITY): Payer: Self-pay

## 2020-11-04 DIAGNOSIS — N951 Menopausal and female climacteric states: Secondary | ICD-10-CM | POA: Diagnosis not present

## 2020-11-04 DIAGNOSIS — D259 Leiomyoma of uterus, unspecified: Secondary | ICD-10-CM | POA: Diagnosis not present

## 2020-11-04 MED ORDER — MEFENAMIC ACID 250 MG PO CAPS
500.0000 mg | ORAL_CAPSULE | Freq: Three times a day (TID) | ORAL | 6 refills | Status: DC
  Filled 2020-11-04 – 2020-11-06 (×4): qty 30, 5d supply, fill #0

## 2020-11-05 ENCOUNTER — Other Ambulatory Visit (HOSPITAL_COMMUNITY): Payer: Self-pay

## 2020-11-05 NOTE — Progress Notes (Signed)
Chief Complaint:   OBESITY Patricia Tran is here to discuss her progress with her obesity treatment plan along with follow-up of her obesity related diagnoses. Patricia Tran is on the Category 2 Plan and states she is following her eating plan approximately 60% of the time. Patricia Tran states she is exercising with a trainer for 30 minutes 3 times per week.  Today's visit was #: 6 Starting weight: 185 lbs Starting date: 08/11/2020 Today's weight: 180 lbs Today's date: 10/29/2020 Total lbs lost to date: 5 Total lbs lost since last in-office visit: 1  Interim History: Patricia Tran will continue to do well with weight loss. She has added strengthening exercise and she is getting cardio in as well.  Subjective:   1. Vitamin D deficiency Patricia Tran is stable on Vit D, and she lost her last prescription and she is asking for another. She denies signs of over-replacement.  2. At risk for osteoporosis Patricia Tran is at higher risk of osteopenia and osteoporosis due to Vitamin D deficiency.   Assessment/Plan:   1. Vitamin D deficiency Low Vitamin D level contributes to fatigue and are associated with obesity, breast, and colon cancer. We will refill prescription Vitamin D for 1 month. Patricia Tran will follow-up for routine testing of Vitamin D, at least 2-3 times per year to avoid over-replacement.  - Vitamin D, Ergocalciferol, (DRISDOL) 1.25 MG (50000 UNIT) CAPS capsule; Take 1 capsule (50,000 Units total) by mouth every 7 (seven) days.  Dispense: 4 capsule; Refill: 0  2. At risk for osteoporosis Patricia Tran was given approximately 15 minutes of osteoporosis prevention counseling today. Patricia Tran is at risk for osteopenia and osteoporosis due to her Vitamin D deficiency. She was encouraged to take her Vitamin D and follow her higher calcium diet and increase strengthening exercise to help strengthen her bones and decrease her risk of osteopenia and osteoporosis.  Repetitive spaced learning was employed today to elicit superior memory formation and  behavioral change.  3. Obesity with current BMI 32.0 Patricia Tran is currently in the action stage of change. As such, her goal is to continue with weight loss efforts. She has agreed to the Category 2 Plan.   Exercise goals: As is.  Behavioral modification strategies: increasing lean protein intake and decreasing simple carbohydrates.  Patricia Tran has agreed to follow-up with our clinic in 3 weeks. She was informed of the importance of frequent follow-up visits to maximize her success with intensive lifestyle modifications for her multiple health conditions.   Objective:   Blood pressure 112/76, pulse 99, temperature 98 F (36.7 C), height '5\' 3"'$  (1.6 m), weight 180 lb (81.6 kg), SpO2 97 %. Body mass index is 31.89 kg/m.  General: Cooperative, alert, well developed, in no acute distress. HEENT: Conjunctivae and lids unremarkable. Cardiovascular: Regular rhythm.  Lungs: Normal work of breathing. Neurologic: No focal deficits.   Lab Results  Component Value Date   CREATININE 0.82 08/11/2020   BUN 12 08/11/2020   NA 142 08/11/2020   K 4.4 08/11/2020   CL 105 08/11/2020   CO2 22 08/11/2020   Lab Results  Component Value Date   ALT 30 08/11/2020   AST 19 08/11/2020   ALKPHOS 89 08/11/2020   BILITOT 0.3 08/11/2020   Lab Results  Component Value Date   HGBA1C 5.4 08/11/2020   HGBA1C 5.4 10/01/2018   Lab Results  Component Value Date   INSULIN 10.2 08/11/2020   Lab Results  Component Value Date   TSH 1.480 08/25/2020   Lab Results  Component Value  Date   CHOL 195 08/11/2020   HDL 42 08/11/2020   LDLCALC 130 (H) 08/11/2020   TRIG 127 08/11/2020   Lab Results  Component Value Date   VD25OH 33.7 08/11/2020   Lab Results  Component Value Date   WBC 6.6 08/11/2020   HGB 12.6 08/11/2020   HCT 39.9 08/11/2020   MCV 87 08/11/2020   PLT 331 10/01/2018   No results found for: IRON, TIBC, FERRITIN  Attestation Statements:   Reviewed by clinician on day of visit: allergies,  medications, problem list, medical history, surgical history, family history, social history, and previous encounter notes.   I, Trixie Dredge, am acting as transcriptionist for Dennard Nip, MD.  I have reviewed the above documentation for accuracy and completeness, and I agree with the above. -  Dennard Nip, MD

## 2020-11-06 ENCOUNTER — Other Ambulatory Visit (HOSPITAL_COMMUNITY): Payer: Self-pay

## 2020-11-06 MED ORDER — MELOXICAM 15 MG PO TABS
15.0000 mg | ORAL_TABLET | Freq: Every day | ORAL | 0 refills | Status: DC
  Filled 2020-11-06: qty 30, 30d supply, fill #0

## 2020-11-06 MED ORDER — TRANEXAMIC ACID 650 MG PO TABS
1300.0000 mg | ORAL_TABLET | Freq: Three times a day (TID) | ORAL | 3 refills | Status: AC
  Filled 2020-11-06: qty 30, 5d supply, fill #0

## 2020-11-10 ENCOUNTER — Ambulatory Visit (AMBULATORY_SURGERY_CENTER): Payer: 59 | Admitting: *Deleted

## 2020-11-10 ENCOUNTER — Other Ambulatory Visit: Payer: Self-pay

## 2020-11-10 ENCOUNTER — Other Ambulatory Visit (HOSPITAL_COMMUNITY): Payer: Self-pay

## 2020-11-10 ENCOUNTER — Ambulatory Visit (INDEPENDENT_AMBULATORY_CARE_PROVIDER_SITE_OTHER): Payer: 59 | Admitting: Family Medicine

## 2020-11-10 ENCOUNTER — Encounter (INDEPENDENT_AMBULATORY_CARE_PROVIDER_SITE_OTHER): Payer: Self-pay | Admitting: Family Medicine

## 2020-11-10 VITALS — HR 95 | Temp 98.1°F | Ht 63.0 in | Wt 181.0 lb

## 2020-11-10 VITALS — Ht 63.0 in | Wt 181.0 lb

## 2020-11-10 DIAGNOSIS — E559 Vitamin D deficiency, unspecified: Secondary | ICD-10-CM

## 2020-11-10 DIAGNOSIS — E669 Obesity, unspecified: Secondary | ICD-10-CM

## 2020-11-10 DIAGNOSIS — Z9189 Other specified personal risk factors, not elsewhere classified: Secondary | ICD-10-CM | POA: Diagnosis not present

## 2020-11-10 DIAGNOSIS — Z6832 Body mass index (BMI) 32.0-32.9, adult: Secondary | ICD-10-CM

## 2020-11-10 DIAGNOSIS — Z1211 Encounter for screening for malignant neoplasm of colon: Secondary | ICD-10-CM

## 2020-11-10 DIAGNOSIS — E66811 Obesity, class 1: Secondary | ICD-10-CM

## 2020-11-10 MED ORDER — NA SULFATE-K SULFATE-MG SULF 17.5-3.13-1.6 GM/177ML PO SOLN
1.0000 | ORAL | 0 refills | Status: DC
  Filled 2020-11-10: qty 354, 1d supply, fill #0

## 2020-11-10 MED ORDER — VITAMIN D (ERGOCALCIFEROL) 1.25 MG (50000 UNIT) PO CAPS
50000.0000 [IU] | ORAL_CAPSULE | ORAL | 0 refills | Status: DC
  Filled 2020-11-10: qty 4, 28d supply, fill #0

## 2020-11-10 NOTE — Progress Notes (Signed)
Patient's pre-visit was done today over the phone with the patient due to COVID-19 pandemic. Name,DOB and address verified. Insurance verified. Patient denies any allergies to Eggs and Soy. Patient denies any problems with anesthesia/sedation. Patient denies taking diet pills or blood thinners. No home Oxygen. Packet of Prep instructions mailed to patient including a copy of a consent form-pt is aware. Patient understands to call us back with any questions or concerns. Patient is aware of our care-partner policy and 0000000 safety protocol.   EMMI education assigned to the patient for the procedure, sent to [place].   The patient is COVID-19 vaccinated.

## 2020-11-12 ENCOUNTER — Other Ambulatory Visit (HOSPITAL_COMMUNITY): Payer: Self-pay

## 2020-11-12 NOTE — Progress Notes (Signed)
Chief Complaint:   OBESITY Patricia Tran is here to discuss her progress with her obesity treatment plan along with follow-up of her obesity related diagnoses. Patricia Tran is on the Category 2 Plan and states she is following her eating plan approximately 60% of the time. Patricia Tran states she is exercising with a trainer doing cardio and weights for 90 minutes 3 times per week.  Today's visit was #: 7 Starting weight: 185 lbs Starting date: 08/11/2020 Today's weight: 181 lbs Today's date: 11/10/2020 Total lbs lost to date: 4 Total lbs lost since last in-office visit: 0  Interim History: Patricia Tran continues to work on diet and exercise. She notes struggling with dinner and she has been eating out more. She remains mindful and she is able to make better choices, but with her protein decreased she may be decreasing her RMR  Subjective:   1. Vitamin D deficiency Patricia Tran is stable in Vit D, and she denies signs of over-replacement.  2. At risk for impaired metabolic function Patricia Tran is at increased risk for impaired metabolic function due to decreased protein.  Assessment/Plan:   1. Vitamin D deficiency Low Vitamin D level contributes to fatigue and are associated with obesity, breast, and colon cancer. We will refill prescription Vitamin D for 1 month. Patricia Tran will follow-up for routine testing of Vitamin D, at least 2-3 times per year to avoid over-replacement.  - Vitamin D, Ergocalciferol, (DRISDOL) 1.25 MG (50000 UNIT) CAPS capsule; Take 1 capsule (50,000 Units total) by mouth every 7 (seven) days.  Dispense: 4 capsule; Refill: 0  2. At risk for impaired metabolic function Patricia Tran was given approximately 15 minutes of impaired  metabolic function prevention counseling today. We discussed intensive lifestyle modifications today with an emphasis on specific nutrition and exercise instructions and strategies.   Repetitive spaced learning was employed today to elicit superior memory formation and behavioral change.  3.  Obesity with current BMI 32.2 Patricia Tran is currently in the action stage of change. As such, her goal is to continue with weight loss efforts. She has agreed to the Category 2 Plan.   Exercise goals: As is.  Behavioral modification strategies: increasing lean protein intake and emotional eating strategies.  Patricia Tran has agreed to follow-up with our clinic in 2 to 3 weeks. She was informed of the importance of frequent follow-up visits to maximize her success with intensive lifestyle modifications for her multiple health conditions.   Objective:   Pulse 95, temperature 98.1 F (36.7 C), height '5\' 3"'$  (1.6 m), weight 181 lb (82.1 kg), SpO2 97 %. Body mass index is 32.06 kg/m.  General: Cooperative, alert, well developed, in no acute distress. HEENT: Conjunctivae and lids unremarkable. Cardiovascular: Regular rhythm.  Lungs: Normal work of breathing. Neurologic: No focal deficits.   Lab Results  Component Value Date   CREATININE 0.82 08/11/2020   BUN 12 08/11/2020   NA 142 08/11/2020   K 4.4 08/11/2020   CL 105 08/11/2020   CO2 22 08/11/2020   Lab Results  Component Value Date   ALT 30 08/11/2020   AST 19 08/11/2020   ALKPHOS 89 08/11/2020   BILITOT 0.3 08/11/2020   Lab Results  Component Value Date   HGBA1C 5.4 08/11/2020   HGBA1C 5.4 10/01/2018   Lab Results  Component Value Date   INSULIN 10.2 08/11/2020   Lab Results  Component Value Date   TSH 1.480 08/25/2020   Lab Results  Component Value Date   CHOL 195 08/11/2020   HDL 42  08/11/2020   LDLCALC 130 (H) 08/11/2020   TRIG 127 08/11/2020   Lab Results  Component Value Date   VD25OH 33.7 08/11/2020   Lab Results  Component Value Date   WBC 6.6 08/11/2020   HGB 12.6 08/11/2020   HCT 39.9 08/11/2020   MCV 87 08/11/2020   PLT 331 10/01/2018   No results found for: IRON, TIBC, FERRITIN  Attestation Statements:   Reviewed by clinician on day of visit: allergies, medications, problem list, medical history,  surgical history, family history, social history, and previous encounter notes.   I, Trixie Dredge, am acting as transcriptionist for Dennard Nip, MD.  I have reviewed the above documentation for accuracy and completeness, and I agree with the above. -  Dennard Nip, MD

## 2020-11-16 ENCOUNTER — Other Ambulatory Visit (HOSPITAL_COMMUNITY): Payer: Self-pay

## 2020-11-18 ENCOUNTER — Other Ambulatory Visit (HOSPITAL_COMMUNITY): Payer: Self-pay

## 2020-11-25 ENCOUNTER — Other Ambulatory Visit (HOSPITAL_COMMUNITY): Payer: Self-pay

## 2020-11-25 ENCOUNTER — Ambulatory Visit (AMBULATORY_SURGERY_CENTER): Payer: 59 | Admitting: Internal Medicine

## 2020-11-25 ENCOUNTER — Encounter: Payer: Self-pay | Admitting: Internal Medicine

## 2020-11-25 ENCOUNTER — Other Ambulatory Visit: Payer: Self-pay

## 2020-11-25 VITALS — BP 124/65 | HR 85 | Temp 98.7°F | Resp 13 | Ht 63.0 in | Wt 181.0 lb

## 2020-11-25 DIAGNOSIS — R109 Unspecified abdominal pain: Secondary | ICD-10-CM

## 2020-11-25 DIAGNOSIS — Z1211 Encounter for screening for malignant neoplasm of colon: Secondary | ICD-10-CM | POA: Diagnosis not present

## 2020-11-25 MED ORDER — DICYCLOMINE HCL 10 MG PO CAPS
20.0000 mg | ORAL_CAPSULE | Freq: Three times a day (TID) | ORAL | 1 refills | Status: DC | PRN
  Filled 2020-11-25: qty 60, 10d supply, fill #0

## 2020-11-25 MED ORDER — SODIUM CHLORIDE 0.9 % IV SOLN: 500.0000 mL | Freq: Once | INTRAVENOUS | Status: DC

## 2020-11-25 NOTE — Patient Instructions (Addendum)
Handout given for diverticulosis.  No biopsies were taken today.  Repeat colonoscopy in 10 years.  Try using benefiber daily and make sure to drink at least 64 oz water to improved bowel movements.  YOU HAD AN ENDOSCOPIC PROCEDURE TODAY AT [facility]:   Refer to the procedure report that was given to you for any specific questions about what was found during the examination.  If the procedure report does not answer your questions, please call your gastroenterologist to clarify.  If you requested that your care partner not be given the details of your procedure findings, then the procedure report has been included in a sealed envelope for you to review at your convenience later.  YOU SHOULD EXPECT: Some feelings of bloating in the abdomen. Passage of more gas than usual.  Walking can help get rid of the air that was put into your GI tract during the procedure and reduce the bloating. If you had a lower endoscopy (such as a colonoscopy or flexible sigmoidoscopy) you may notice spotting of blood in your stool or on the toilet paper. If you underwent a bowel prep for your procedure, you may not have a normal bowel movement for a few days.  Please Note:  You might notice some irritation and congestion in your nose or some drainage.  This is from the oxygen used during your procedure.  There is no need for concern and it should clear up in a day or so.  SYMPTOMS TO REPORT IMMEDIATELY:  Following lower endoscopy (colonoscopy or flexible sigmoidoscopy):  Excessive amounts of blood in the stool  Significant tenderness or worsening of abdominal pains  Swelling of the abdomen that is new, acute  Fever of 100F or higher  For urgent or emergent issues, a gastroenterologist can be reached at any hour by calling [phone]. Do not use MyChart messaging for urgent concerns.    DIET:  We do recommend a small meal at first, but then you may proceed to your regular diet.  Drink plenty of  fluids but you should avoid alcoholic beverages for 24 hours.  ACTIVITY:  You should plan to take it easy for the rest of today and you should NOT DRIVE or use heavy machinery until tomorrow (because of the sedation medicines used during the test).    FOLLOW UP: Our staff will call the number listed on your records 48-72 hours following your procedure to check on you and address any questions or concerns that you may have regarding the information given to you following your procedure. If we do not reach you, we will leave a message.  We will attempt to reach you two times.  During this call, we will ask if you have developed any symptoms of COVID 19. If you develop any symptoms (ie: fever, flu-like symptoms, shortness of breath, cough etc.) before then, please call [phone].  If you test positive for Covid 19 in the 2 weeks post procedure, please call and report this information to [place].    If any biopsies were taken you will be contacted by phone or by letter within the next 1-3 weeks.  Please call us at [phone] if you have not heard about the biopsies in 3 weeks.    SIGNATURES/CONFIDENTIALITY: You and/or your care partner have signed paperwork which will be entered into your electronic medical record.  These signatures attest to the fact that that the information above on your After Visit Summary has been reviewed and is understood.  Full responsibility of the confidentiality of this discharge information lies with you and/or your care-partner.  

## 2020-11-25 NOTE — Progress Notes (Signed)
GASTROENTEROLOGY PROCEDURE H&P NOTE   Primary Care Physician: Ma Hillock, DO    Reason for Procedure:  Colon cancer screening  Plan:    Colonoscopy  Patient is appropriate for endoscopic procedure(s) in the ambulatory ([facility]) setting.  The nature of the procedure, as well as the risks, benefits, and alternatives were carefully and thoroughly reviewed with the patient. Ample time for discussion and questions allowed. The patient understood, was satisfied, and agreed to proceed.     HPI: Patricia Tran is a 50 y.o. female who presents for screening colonoscopy.  Medical history as below.  No complaints today including chest pain or shortness of breath.  Past Medical History:  Diagnosis Date   Allergy    Anemia    Anxiety    Asthma    Constipation    Depression    Diverticulitis    Fibroids 2017   left sided uterine leiomyoma    Gallbladder disease    GERD (gastroesophageal reflux disease)    Goiter    Hyperlipidemia    Joint pain    Kidney stone 08/31/2016   incidental find on CT right renal calculus   Lactose intolerance    Lump in neck    Other fatigue    Ovarian cyst 08/31/2016   1.5 cm left ovarian cyst   SOB (shortness of breath) on exertion    Swallowing difficulty    Thyroid nodule 03/2013   2.6x1.9x1.8 cm on last US thyroid 02/2015- "stable".  lg mixed schogenic nodule left thyroid. "Follicular lesion of unknown significance"    Past Surgical History:  Procedure Laterality Date   BIOPSY THYROID  06/2020   CHOLECYSTECTOMY  2011   POST OP ITCHING   TRANSAXILLERY ENDOSCOPIC LEFT THYROID LOBECTOMY WITH ROBOTIC ASSISTANCE Left 08/2020   scheduled end of May 2022.nodule- high concern for malignancy    Prior to Admission medications   Medication Sig Start Date End Date Taking? Authorizing Provider  buPROPion (WELLBUTRIN XL) 300 MG 24 hr tablet Take 1 tablet (300 mg total) by mouth daily. 08/31/20  Yes Kuneff, Renee A, DO  levothyroxine  (SYNTHROID) 50 MCG tablet Take 1 tablet (50 mcg total) by mouth on an empty stomach with a glass of water at least 30-60 minutes before breakfast daily 10/29/20  Yes   meloxicam (MOBIC) 15 MG tablet Take 1 tablet (15 mg total) by mouth daily. 11/06/20  Yes   Multiple Vitamin (MULTIVITAMIN) tablet Take 1 tablet by mouth daily.   Yes [provider]  Vitamin D, Ergocalciferol, (DRISDOL) 1.25 MG (50000 UNIT) CAPS capsule Take 1 capsule (50,000 Units total) by mouth every 7 (seven) days. 11/10/20  Yes Beasley, Caren D, MD  LORazepam (ATIVAN) 0.5 MG tablet Take 1 tablet (0.5 mg total) by mouth daily as needed for anxiety. Patient not taking: Reported on 11/25/2020 08/31/20   Ma Hillock, DO    Current Outpatient Medications  Medication Sig Dispense Refill   buPROPion (WELLBUTRIN XL) 300 MG 24 hr tablet Take 1 tablet (300 mg total) by mouth daily. 90 tablet 1   levothyroxine (SYNTHROID) 50 MCG tablet Take 1 tablet (50 mcg total) by mouth on an empty stomach with a glass of water at least 30-60 minutes before breakfast daily 30 tablet 11   meloxicam (MOBIC) 15 MG tablet Take 1 tablet (15 mg total) by mouth daily. 30 tablet 0   Multiple Vitamin (MULTIVITAMIN) tablet Take 1 tablet by mouth daily.     Vitamin D, Ergocalciferol, (DRISDOL) 1.25 MG (  50000 UNIT) CAPS capsule Take 1 capsule (50,000 Units total) by mouth every 7 (seven) days. 4 capsule 0   LORazepam (ATIVAN) 0.5 MG tablet Take 1 tablet (0.5 mg total) by mouth daily as needed for anxiety. (Patient not taking: Reported on 11/25/2020) 90 tablet 1   Current Facility-Administered Medications  Medication Dose Route Frequency Provider Last Rate Last Admin   0.9 %  sodium chloride infusion  500 mL Intravenous Once Nicie Milan, Lajuan Lines, MD        Allergies as of 11/25/2020 - Review Complete 11/25/2020  Allergen Reaction Noted   Codeine Itching 05/13/2020    Family History  Problem Relation Age of Onset   Early death Mother    Kidney disease  Mother    Alcohol abuse Father    Cancer Father        oral HPV   Alcoholism Father    Obesity Father    Colitis Father    Colon polyps Father    Diabetes Maternal Grandmother    Alcohol abuse Maternal Grandfather    Heart disease Maternal Grandfather    Heart attack Paternal Grandmother    Heart attack Paternal Grandfather    Colon cancer Neg Hx    Esophageal cancer Neg Hx    Stomach cancer Neg Hx     Social History   Socioeconomic History   Marital status: Married    Spouse name: Not on file   Number of children: Not on file   Years of education: Not on file   Highest education level: Not on file  Occupational History   Occupation: Print production planner- [employer]  Tobacco Use   Smoking status: Never   Smokeless tobacco: Never  Vaping Use   Vaping Use: Never used  Substance and Sexual Activity   Alcohol use: Not Currently    Comment: RARE   Drug use: Never   Sexual activity: Yes    Partners: Male  Other Topics Concern   Not on file  Social History Narrative   Marital status/children/pets: married, 1 child   Education/employment: BA. Building surveyor.    Safety:      -smoke alarm in the home:Yes     - wears seatbelt: Yes     - Feels safe in their relationships: Yes   Social Determinants of Health   Financial Resource Strain: Not on file  Food Insecurity: Not on file  Transportation Needs: Not on file  Physical Activity: Not on file  Stress: Not on file  Social Connections: Not on file  Intimate Partner Violence: Not on file    Physical Exam: Vital signs in last 24 hours: '@BP'$  (!) 151/100   Pulse 97   Temp 98.7 F (37.1 C) (Skin)   Ht '5\' 3"'$  (1.6 m)   Wt 181 lb (82.1 kg)   SpO2 97%   BMI 32.06 kg/m  GEN: NAD EYE: Sclerae anicteric ENT: MMM CV: Non-tachycardic Pulm: CTA b/l GI: Soft, NT/ND NEURO:  Alert & Oriented x 3   Zenovia Jarred, MD [facility]  11/25/2020 9:04 AM

## 2020-11-25 NOTE — Op Note (Signed)
[place] Patient Name: Patricia Tran Procedure Date: 11/25/2020 9:05 AM MRN: [id number] Endoscopist: Jerene Bears , MD Age: 50 Referring MD:  Date of Birth: 01-05-1971 Gender: Female Account #: [id number] Procedure:                Colonoscopy Indications:              Screening for colorectal malignant neoplasm, This                            is the patient's first colonoscopy Medicines:                Monitored Anesthesia Care Procedure:                Pre-Anesthesia Assessment:                           - Prior to the procedure, a History and Physical                            was performed, and patient medications and                            allergies were reviewed. The patient's tolerance of                            previous anesthesia was also reviewed. The risks                            and benefits of the procedure and the sedation                            options and risks were discussed with the patient.                            All questions were answered, and informed consent                            was obtained. Prior Anticoagulants: The patient has                            taken no previous anticoagulant or antiplatelet                            agents. ASA Grade Assessment: II - A patient with                            mild systemic disease. After reviewing the risks                            and benefits, the patient was deemed in                            satisfactory condition to undergo the procedure.  After obtaining informed consent, the colonoscope                            was passed under direct vision. Throughout the                            procedure, the patient's blood pressure, pulse, and                            oxygen saturations were monitored continuously. The                            CF [id number] was introduced through the anus                            and advanced to  the cecum, identified by                            appendiceal orifice and ileocecal valve. The                            colonoscopy was performed without difficulty. The                            patient tolerated the procedure well. The quality                            of the bowel preparation was good. The ileocecal                            valve, appendiceal orifice, and rectum were                            photographed. Scope In: 9:17:19 AM Scope Out: 9:31:14 AM Scope Withdrawal Time: 0 hours 10 minutes 30 seconds  Total Procedure Duration: 0 hours 13 minutes 55 seconds  Findings:                 The digital rectal exam was normal.                           A few small-mouthed diverticula were found in the                            sigmoid colon.                           The exam was otherwise without abnormality on                            direct and retroflexion views. Complications:            No immediate complications. Estimated Blood Loss:     Estimated blood loss: none. Impression:               - Diverticulosis in the sigmoid colon.                           -  The examination was otherwise normal on direct                            and retroflexion views.                           - No specimens collected. Recommendation:           - Patient has a contact number available for                            emergencies. The signs and symptoms of potential                            delayed complications were discussed with the                            patient. Return to normal activities tomorrow.                            Written discharge instructions were provided to the                            patient.                           - Resume previous diet.                           - Continue present medications.                           - Repeat colonoscopy in 10 years for screening                            purposes. Jerene Bears, MD 11/25/2020 9:34:36  AM This report has been signed electronically.

## 2020-11-25 NOTE — Progress Notes (Signed)
Pt's states no medical or surgical changes since previsit or office visit. VS assessed by N.[place] ?

## 2020-11-25 NOTE — Progress Notes (Signed)
A and O x3. Report to RN. Tolerated MAC anesthesia well. 

## 2020-11-27 ENCOUNTER — Other Ambulatory Visit (HOSPITAL_COMMUNITY): Payer: Self-pay

## 2020-11-27 ENCOUNTER — Telehealth: Payer: Self-pay | Admitting: *Deleted

## 2020-11-27 NOTE — Telephone Encounter (Signed)
  Follow up Call-  Call back number 11/25/2020  Post procedure Call Back phone  # [phone]  Permission to leave phone message Yes  Some recent data might be hidden     Patient questions:  Do you have a fever, pain , or abdominal swelling? No. Pain Score  0 *  Have you tolerated food without any problems? Yes.    Have you been able to return to your normal activities? Yes.    Do you have any questions about your discharge instructions: Diet   No. Medications  No. Follow up visit  No.  Do you have questions or concerns about your Care? No.  Actions: * If pain score is 4 or above: No action needed, pain <4.  Have you developed a fever since your procedure? no  2.   Have you had an respiratory symptoms (SOB or cough) since your procedure? no  3.   Have you tested positive for COVID 19 since your procedure no  4.   Have you had any family members/close contacts diagnosed with the COVID 19 since your procedure?  no   If yes to any of these questions please route to Joylene John, RN and Joella Prince, RN

## 2020-12-01 ENCOUNTER — Other Ambulatory Visit: Payer: Self-pay

## 2020-12-01 ENCOUNTER — Ambulatory Visit (INDEPENDENT_AMBULATORY_CARE_PROVIDER_SITE_OTHER): Payer: 59 | Admitting: Family Medicine

## 2020-12-01 ENCOUNTER — Encounter (INDEPENDENT_AMBULATORY_CARE_PROVIDER_SITE_OTHER): Payer: Self-pay | Admitting: Family Medicine

## 2020-12-01 ENCOUNTER — Other Ambulatory Visit (HOSPITAL_COMMUNITY): Payer: Self-pay

## 2020-12-01 VITALS — BP 123/73 | HR 91 | Temp 98.1°F | Ht 63.0 in | Wt 179.0 lb

## 2020-12-01 DIAGNOSIS — E7800 Pure hypercholesterolemia, unspecified: Secondary | ICD-10-CM | POA: Diagnosis not present

## 2020-12-01 DIAGNOSIS — E8881 Metabolic syndrome: Secondary | ICD-10-CM

## 2020-12-01 DIAGNOSIS — E559 Vitamin D deficiency, unspecified: Secondary | ICD-10-CM

## 2020-12-01 DIAGNOSIS — E669 Obesity, unspecified: Secondary | ICD-10-CM | POA: Diagnosis not present

## 2020-12-01 DIAGNOSIS — Z6832 Body mass index (BMI) 32.0-32.9, adult: Secondary | ICD-10-CM

## 2020-12-01 MED ORDER — VITAMIN D (ERGOCALCIFEROL) 1.25 MG (50000 UNIT) PO CAPS
50000.0000 [IU] | ORAL_CAPSULE | ORAL | 0 refills | Status: DC
  Filled 2020-12-01: qty 4, 28d supply, fill #0

## 2020-12-01 MED ORDER — TIRZEPATIDE 2.5 MG/0.5ML ~~LOC~~ SOAJ
2.5000 mg | SUBCUTANEOUS | 0 refills | Status: DC
  Filled 2020-12-01: qty 2, 28d supply, fill #0

## 2020-12-01 NOTE — Progress Notes (Signed)
Chief Complaint:   OBESITY Patricia Tran is here to discuss her progress with her obesity treatment plan along with follow-up of her obesity related diagnoses. Patricia Tran is on the Category 2 Plan and states she is following her eating plan approximately 50% of the time. Patricia Tran states she is exercising with a trainer, doing cardio and strengthening for 30 minutes 3 times per week.  Today's visit was #: 8 Starting weight: 185 lbs Starting date: 08/11/2020 Today's weight: 179 lbs Today's date: 12/01/2020 Total lbs lost to date: 6 Total lbs lost since last in-office visit: 2  Interim History: Patricia Tran continues with weight loss, but she is especially struggling with the social eating strategies. She is trying to meet her protein goals and making smarter food choices. She is open to looking at GLP-1 options.  Subjective:   1. Vitamin D deficiency Patricia Tran is stable on Vit D, and she needs a refill today.  2. Insulin resistance Patricia Tran is working on diet and exercise, and she is due for labs today.  3. Pure hypercholesterolemia Patricia Tran is working on weight loss and decreasing cholesterol in her diet. She is due for labs.  Assessment/Plan:   1. Vitamin D deficiency Low Vitamin D level contributes to fatigue and are associated with obesity, breast, and colon cancer. We will refill prescription Vitamin D for 1 month. We will check labs today and Patricia Tran will follow-up for routine testing of Vitamin D, at least 2-3 times per year to avoid over-replacement.  - Vitamin D, Ergocalciferol, (DRISDOL) 1.25 MG (50000 UNIT) CAPS capsule; Take 1 capsule (50,000 Units total) by mouth every 7 (seven) days.  Dispense: 4 capsule; Refill: 0 - VITAMIN D 25 Hydroxy (Vit-D Deficiency, Fractures)  2. Insulin resistance Patricia Tran will continue to work on diet, exercise, and weight loss, and decreasing simple carbohydrates to help decrease the risk of diabetes. We will check labs today. Patricia Tran agreed to follow-up with [place] as directed to closely  monitor her progress.  - CMP14+EGFR - Insulin, random - Hemoglobin A1c  3. Pure hypercholesterolemia Cardiovascular risk and specific lipid/LDL goals reviewed. We discussed several lifestyle modifications today. We will check labs today. Patricia Tran will continue to work on diet, exercise and weight loss efforts. Orders and follow up as documented in patient record.   - Lipid Panel With LDL/HDL Ratio  4. Obesity with current BMI 31.8 Patricia Tran is currently in the action stage of change. As such, her goal is to continue with weight loss efforts. She has agreed to the Category 2 Plan.   We discussed various medication options to help Patricia Tran with her weight loss efforts and we both agreed to start Mounjaro 2.5 mg q weekly with no refills.  - tirzepatide [facility]) 2.5 MG/0.5ML Pen; Inject 2.5 mg into the skin once a week.  Dispense: 2 mL; Refill: 0  Exercise goals: As is.  Behavioral modification strategies: increasing lean protein intake.  Patricia Tran has agreed to follow-up with our clinic in 3 weeks. She was informed of the importance of frequent follow-up visits to maximize her success with intensive lifestyle modifications for her multiple health conditions.   Objective:   Blood pressure 123/73, pulse 91, temperature 98.1 F (36.7 C), height $RemoveBe'5\' 3"'AmAnneLCb$  (1.6 m), weight 179 lb (81.2 kg), SpO2 99 %. Body mass index is 31.71 kg/m.  General: Cooperative, alert, well developed, in no acute distress. HEENT: Conjunctivae and lids unremarkable. Cardiovascular: Regular rhythm.  Lungs: Normal work of breathing. Neurologic: No focal deficits.   Lab Results  Component Value Date   CREATININE 0.82 08/11/2020   BUN 12 08/11/2020   NA 142 08/11/2020   K 4.4 08/11/2020   CL 105 08/11/2020   CO2 22 08/11/2020   Lab Results  Component Value Date   ALT 30 08/11/2020   AST 19 08/11/2020   ALKPHOS 89 08/11/2020   BILITOT 0.3 08/11/2020   Lab Results  Component Value Date   HGBA1C 5.4 08/11/2020   HGBA1C  5.4 10/01/2018   Lab Results  Component Value Date   INSULIN 10.2 08/11/2020   Lab Results  Component Value Date   TSH 1.480 08/25/2020   Lab Results  Component Value Date   CHOL 195 08/11/2020   HDL 42 08/11/2020   LDLCALC 130 (H) 08/11/2020   TRIG 127 08/11/2020   Lab Results  Component Value Date   VD25OH 33.7 08/11/2020   Lab Results  Component Value Date   WBC 6.6 08/11/2020   HGB 12.6 08/11/2020   HCT 39.9 08/11/2020   MCV 87 08/11/2020   PLT 331 10/01/2018   No results found for: IRON, TIBC, FERRITIN  Attestation Statements:   Reviewed by clinician on day of visit: allergies, medications, problem list, medical history, surgical history, family history, social history, and previous encounter notes.  Time spent on visit including pre-visit chart review and post-visit care and charting was 35 minutes.    I, Trixie Dredge, am acting as transcriptionist for Dennard Nip, MD.  I have reviewed the above documentation for accuracy and completeness, and I agree with the above. -  Dennard Nip, MD

## 2020-12-02 ENCOUNTER — Other Ambulatory Visit (HOSPITAL_COMMUNITY): Payer: Self-pay

## 2020-12-02 LAB — LIPID PANEL WITH LDL/HDL RATIO
Cholesterol, Total: 194 mg/dL (ref 100–199)
HDL: 38 mg/dL — ABNORMAL LOW (ref >39)
LDL Chol Calc (NIH): 136 mg/dL — ABNORMAL HIGH (ref 0–99)
LDL/HDL Ratio: 3.6 ratio — ABNORMAL HIGH (ref 0.0–3.2)
Triglycerides: 109 mg/dL (ref 0–149)
VLDL Cholesterol Cal: 20 mg/dL (ref 5–40)

## 2020-12-02 LAB — CMP14+EGFR
ALT: 24 IU/L (ref 0–32)
AST: 20 IU/L (ref 0–40)
Albumin/Globulin Ratio: 1.3 (ref 1.2–2.2)
Albumin: 3.9 g/dL (ref 3.8–4.9)
Alkaline Phosphatase: 102 IU/L (ref 44–121)
BUN/Creatinine Ratio: 15 (ref 9–23)
BUN: 12 mg/dL (ref 6–24)
Bilirubin Total: 0.3 mg/dL (ref 0.0–1.2)
CO2: 22 mmol/L (ref 20–29)
Calcium: 8.8 mg/dL (ref 8.7–10.2)
Chloride: 105 mmol/L (ref 96–106)
Creatinine, Ser: 0.81 mg/dL (ref 0.57–1.00)
Globulin, Total: 3 g/dL (ref 1.5–4.5)
Glucose: 78 mg/dL (ref 65–99)
Potassium: 4.6 mmol/L (ref 3.5–5.2)
Sodium: 140 mmol/L (ref 134–144)
Total Protein: 6.9 g/dL (ref 6.0–8.5)
eGFR: 87 mL/min/{1.73_m2} (ref >59)

## 2020-12-02 LAB — VITAMIN D 25 HYDROXY (VIT D DEFICIENCY, FRACTURES): Vit D, 25-Hydroxy: 57 ng/mL (ref 30.0–100.0)

## 2020-12-02 LAB — HEMOGLOBIN A1C
Est. average glucose Bld gHb Est-mCnc: 111 mg/dL
Hgb A1c MFr Bld: 5.5 % (ref 4.8–5.6)

## 2020-12-02 LAB — INSULIN, RANDOM: INSULIN: 9 u[IU]/mL (ref 2.6–24.9)

## 2020-12-03 ENCOUNTER — Other Ambulatory Visit (HOSPITAL_COMMUNITY): Payer: Self-pay

## 2020-12-04 ENCOUNTER — Other Ambulatory Visit (HOSPITAL_COMMUNITY): Payer: Self-pay

## 2020-12-07 ENCOUNTER — Other Ambulatory Visit (HOSPITAL_COMMUNITY): Payer: Self-pay

## 2020-12-08 ENCOUNTER — Other Ambulatory Visit (HOSPITAL_COMMUNITY): Payer: Self-pay

## 2020-12-11 DIAGNOSIS — C73 Malignant neoplasm of thyroid gland: Secondary | ICD-10-CM | POA: Diagnosis not present

## 2020-12-15 ENCOUNTER — Encounter (INDEPENDENT_AMBULATORY_CARE_PROVIDER_SITE_OTHER): Payer: Self-pay

## 2020-12-15 ENCOUNTER — Telehealth: Payer: 59 | Admitting: Physician Assistant

## 2020-12-15 ENCOUNTER — Other Ambulatory Visit (HOSPITAL_COMMUNITY): Payer: Self-pay

## 2020-12-15 DIAGNOSIS — R42 Dizziness and giddiness: Secondary | ICD-10-CM

## 2020-12-15 DIAGNOSIS — J019 Acute sinusitis, unspecified: Secondary | ICD-10-CM

## 2020-12-15 MED ORDER — DOXYCYCLINE HYCLATE 100 MG PO CAPS
100.0000 mg | ORAL_CAPSULE | Freq: Two times a day (BID) | ORAL | 0 refills | Status: DC
  Filled 2020-12-15: qty 20, 10d supply, fill #0

## 2020-12-15 MED ORDER — FLUTICASONE PROPIONATE 50 MCG/ACT NA SUSP
2.0000 | Freq: Every day | NASAL | 0 refills | Status: DC
  Filled 2020-12-15: qty 16, 30d supply, fill #0

## 2020-12-15 MED ORDER — MECLIZINE HCL 25 MG PO TABS
25.0000 mg | ORAL_TABLET | Freq: Three times a day (TID) | ORAL | 0 refills | Status: DC | PRN
  Filled 2020-12-15: qty 30, 10d supply, fill #0

## 2020-12-15 NOTE — Progress Notes (Signed)
Ms. Patricia Tran are scheduled for a virtual visit with your provider today.    Just as we do with appointments in the office, we must obtain your consent to participate.  Your consent will be active for this visit and any virtual visit you may have with one of our providers in the next 365 days.    If you have a MyChart account, I can also send a copy of this consent to you electronically.  All virtual visits are billed to your insurance company just like a traditional visit in the office.  As this is a virtual visit, video technology does not allow for your provider to perform a traditional examination.  This may limit your provider's ability to fully assess your condition.  If your provider identifies any concerns that need to be evaluated in person or the need to arrange testing such as labs, EKG, etc, we will make arrangements to do so.    Although advances in technology are sophisticated, we cannot ensure that it will always work on either your end or our end.  If the connection with a video visit is poor, we may have to switch to a telephone visit.  With either a video or telephone visit, we are not always able to ensure that we have a secure connection.   I need to obtain your verbal consent now.   Are you willing to proceed with your visit today?   Jadaisha Spangler-Phillips has provided verbal consent on 12/15/2020 for a virtual visit (video or telephone).   Abigail Butts, PA-C 12/15/2020  11:27 AM   Date:  12/15/2020   ID:  Luiz Ochoa, DOB 1971/01/21, MRN [id number]  Patient Location: Home Provider Location: Home Office   Participants: Patient and Provider for Visit and Wrap up  Method of visit: Video  Location of Patient: Home Location of Provider: Home Office Consent was obtain for visit over the video. Services rendered by provider: Visit was performed via video  A video enabled telemedicine application was used and I verified that I am speaking with the  correct person using two identifiers.  PCP:  Ma Hillock, DO   Chief Complaint:  dizzy  History of Present Illness:    Capree Mccathern is a 50 y.o. female with history as stated below. Presents video telehealth for an acute care visit  Pt reports she developed cold symptoms 1 week ago.  Sneezing, eyes burning, coughing up phlegm, sinus pain and purulent drainage from her nose.  Pt has been taking OTC medications with relief.    Pt reports she developed URI symptoms on Tuesday and then went diving on Saturday.  She reports taking afrin the morning she went diving.  Reports she descended without difficulty, but when she ascended she became very dizzy.  No LOC.  She reports she had several more dives that morning. She then developed nausea.  Skipped the second dive and went for the 3rd dive without difficulty, but generally felt unwell.   She reports she was persistently dizzy after getting off the boat.   Pt reports she continues to  have L ear pain and postnasal drip.  She reports she continues to be dizzy and is worse with bending and turning of the head.  She reports she can generally hear out of both ears.  Pt reports the nasal congestion is very thick. No maxillary pain.   Meds taken include - sudafed 12 her, loratadine 24 hr, afrin and bonamine before diving.  No other aggravating  or relieving factors.  No other c/o.  Past Medical, Surgical, Social History, Allergies, and Medications have been Reviewed.  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 06/18/2019   Mild episode of  recurrent major depressive disorder ([place]) 05/13/2019    Social History   Tobacco Use   Smoking status: Never   Smokeless tobacco: Never  Substance Use Topics   Alcohol use: Not Currently    Comment: RARE     Current Outpatient Medications:    doxycycline (VIBRAMYCIN) 100 MG capsule, Take 1 capsule (100 mg total) by mouth 2 (two) times daily., Disp: 20 capsule, Rfl: 0   fluticasone (FLONASE) 50 MCG/ACT nasal spray, Place 2 sprays into both nostrils daily., Disp: 16 g, Rfl: 0   meclizine (ANTIVERT) 25 MG tablet, Take 1 tablet (25 mg total) by mouth 3 (three) times daily as needed for dizziness., Disp: 30 tablet, Rfl: 0   buPROPion (WELLBUTRIN XL) 300 MG 24 hr tablet, Take 1 tablet (300 mg total) by mouth daily., Disp: 90 tablet, Rfl: 1   dicyclomine (BENTYL) 10 MG capsule, Take 2 capsules (20 mg total) by mouth 3 (three) times daily as needed for spasms., Disp: 60 capsule, Rfl: 1   levothyroxine (SYNTHROID) 50 MCG tablet, Take 1 tablet (50 mcg total) by mouth on an empty stomach with a glass of water at least 30-60 minutes before breakfast daily, Disp: 30 tablet, Rfl: 11   LORazepam (ATIVAN) 0.5 MG tablet, Take 1 tablet (0.5 mg total) by mouth daily as needed for anxiety., Disp: 90 tablet, Rfl: 1   meloxicam (MOBIC) 15 MG tablet, Take 1 tablet (15 mg total) by mouth daily., Disp: 30 tablet, Rfl: 0   Multiple Vitamin (MULTIVITAMIN) tablet, Take 1 tablet by mouth daily., Disp: , Rfl:    tirzepatide (MOUNJARO) 2.5 MG/0.5ML Pen, Inject 2.5 mg into the skin once a week., Disp: 2 mL, Rfl: 0   Vitamin D, Ergocalciferol, (DRISDOL) 1.25 MG (50000 UNIT) CAPS capsule, Take 1 capsule (50,000 Units total) by mouth every 7 (seven) days., Disp: 4 capsule, Rfl: 0   Allergies  Allergen Reactions   Codeine Itching     Review of Systems  Constitutional:  Negative for chills and fever.  HENT:  Positive for congestion, ear pain and sore throat.   Eyes:  Negative for blurred vision and double vision.   Respiratory:  Positive for cough. Negative for shortness of breath and wheezing.   Cardiovascular:  Negative for chest pain, palpitations and leg swelling.  Gastrointestinal:  Negative for abdominal pain, diarrhea, nausea and vomiting.  Genitourinary:  Negative for dysuria.  Musculoskeletal:  Negative for myalgias.  Skin:  Negative for rash.  Neurological:  Positive for dizziness. Negative for loss of consciousness, weakness and headaches.  Psychiatric/Behavioral:  The patient is not nervous/anxious.   See HPI for history of present illness.  Physical Exam Constitutional:      General: She is not in acute distress.    Appearance: Normal appearance. She is not ill-appearing.  HENT:     Head: Normocephalic and atraumatic.     Nose: No  congestion.  Eyes:     Extraocular Movements: Extraocular movements intact.  Pulmonary:     Effort: Pulmonary effort is normal.     Comments: Speaks in full sentences Musculoskeletal:        General: Normal range of motion.     Cervical back: Normal range of motion.  Skin:    Coloration: Skin is not pale.  Neurological:     General: No focal deficit present.     Mental Status: She is alert. Mental status is at baseline.     Comments: No slurred speech No facial droop  Psychiatric:        Mood and Affect: Mood normal.              A&P  1. Acute sinusitis, recurrence not specified, unspecified location  - doxycycline  - Flonase  - continue OTC loratadine  - Stop sudafed and start OTC mucinex '1200mg'$  2x per day without decongestants  2. Dizziness  - suspect BPPV given recent URI and diving; no gross neuro deficit to suggest CVA.    - start meclizine as needed for dizziness, drink plenty of fluids  - follow-up with ENT if no improvement in the next 2-3 days: [facility] [address], [place] [address] [phone]   Patient voiced understanding and agreement to plan.   Time:   Today,  I have spent 15 minutes with the patient with telehealth technology discussing the above problems, reviewing the chart, previous notes, medications and orders.    Tests Ordered: No orders of the defined types were placed in this encounter.   Medication Changes: Meds ordered this encounter  Medications   doxycycline (VIBRAMYCIN) 100 MG capsule    Sig: Take 1 capsule (100 mg total) by mouth 2 (two) times daily.    Dispense:  20 capsule    Refill:  0   fluticasone (FLONASE) 50 MCG/ACT nasal spray    Sig: Place 2 sprays into both nostrils daily.    Dispense:  16 g    Refill:  0   meclizine (ANTIVERT) 25 MG tablet    Sig: Take 1 tablet (25 mg total) by mouth 3 (three) times daily as needed for dizziness.    Dispense:  30 tablet    Refill:  0     Disposition:  Follow up ENT as needed  Signed, Abigail Butts, PA-C  12/15/2020 11:27 AM

## 2020-12-15 NOTE — Patient Instructions (Signed)
1. Acute sinusitis, recurrence not specified, unspecified location  - doxycycline  - Flonase  - continue OTC loratadine  - Stop sudafed and start OTC mucinex '1200mg'$  2x per day without decongestants  2. Dizziness  - suspect BPPV given recent URI and diving; no gross neuro deficit to suggest CVA.    - start meclizine as needed for dizziness, drink plenty of fluids  - follow-up with ENT if no improvement in the next 2-3 days: [facility] [address] [place] [address] [phone]

## 2020-12-22 ENCOUNTER — Ambulatory Visit (INDEPENDENT_AMBULATORY_CARE_PROVIDER_SITE_OTHER): Payer: 59 | Admitting: Family Medicine

## 2020-12-22 ENCOUNTER — Encounter (INDEPENDENT_AMBULATORY_CARE_PROVIDER_SITE_OTHER): Payer: Self-pay | Admitting: Family Medicine

## 2020-12-22 ENCOUNTER — Other Ambulatory Visit (HOSPITAL_COMMUNITY): Payer: Self-pay

## 2020-12-22 ENCOUNTER — Other Ambulatory Visit: Payer: Self-pay

## 2020-12-22 VITALS — BP 111/75 | HR 92 | Temp 98.0°F | Ht 63.0 in | Wt 177.0 lb

## 2020-12-22 DIAGNOSIS — E669 Obesity, unspecified: Secondary | ICD-10-CM | POA: Diagnosis not present

## 2020-12-22 DIAGNOSIS — E559 Vitamin D deficiency, unspecified: Secondary | ICD-10-CM

## 2020-12-22 DIAGNOSIS — E8881 Metabolic syndrome: Secondary | ICD-10-CM

## 2020-12-22 DIAGNOSIS — Z9189 Other specified personal risk factors, not elsewhere classified: Secondary | ICD-10-CM | POA: Diagnosis not present

## 2020-12-22 DIAGNOSIS — Z6832 Body mass index (BMI) 32.0-32.9, adult: Secondary | ICD-10-CM | POA: Diagnosis not present

## 2020-12-22 DIAGNOSIS — E66811 Obesity, class 1: Secondary | ICD-10-CM

## 2020-12-22 DIAGNOSIS — E88819 Insulin resistance, unspecified: Secondary | ICD-10-CM

## 2020-12-22 MED ORDER — VITAMIN D (ERGOCALCIFEROL) 1.25 MG (50000 UNIT) PO CAPS
50000.0000 [IU] | ORAL_CAPSULE | ORAL | 0 refills | Status: DC
  Filled 2020-12-22: qty 4, 28d supply, fill #0

## 2020-12-22 MED ORDER — TIRZEPATIDE 2.5 MG/0.5ML ~~LOC~~ SOAJ
2.5000 mg | SUBCUTANEOUS | 0 refills | Status: DC
  Filled 2020-12-22: qty 2, 28d supply, fill #0

## 2020-12-23 ENCOUNTER — Other Ambulatory Visit (HOSPITAL_COMMUNITY): Payer: Self-pay

## 2020-12-23 NOTE — Progress Notes (Signed)
Chief Complaint:   OBESITY Patricia Tran is here to discuss her progress with her obesity treatment plan along with follow-up of her obesity related diagnoses. Patricia Tran is on the Category 2 Plan and states she is following her eating plan approximately 50% of the time. Patricia Tran states she is doing 0 minutes 0 times per week.  Today's visit was #: 9 Starting weight: 185 lbs Starting date: 08/11/2020 Today's weight: 177 lbs Today's date: 12/22/2020 Total lbs lost to date: 8 Total lbs lost since last in-office visit: 2  Interim History: Patricia Tran continues to do well with weight loss. She hasn't been able to follow her plan as closely but she remains mindful of her choices. She will be doing more traveling this next few weeks.  Subjective:   1. Insulin resistance Patricia Tran continues to work on increasing lean protein. She started [facility] last week and she is tolerating it well.  2. Vitamin D deficiency Patricia Tran is stable on Vit D, and she denies nausea, vomiting, or muscle weakness.  3. At risk for impaired metabolic function Patricia Tran is at increased risk for impaired metabolic function due to current nutrition and muscle mass.  Assessment/Plan:   1. Insulin resistance Patricia Tran will continue to work on weight loss, exercise, and decreasing simple carbohydrates to help decrease the risk of diabetes. We will refill Mounjaro 2.5 mg q weekly for 1 month. Patricia Tran agreed to follow-up with [place] as directed to closely monitor her progress.  - tirzepatide [facility]) 2.5 MG/0.5ML Pen; Inject 2.5 mg into the skin once a week.  Dispense: 2 mL; Refill: 0  2. Vitamin D deficiency Low Vitamin D level contributes to fatigue and are associated with obesity, breast, and colon cancer. We will refill prescription Vitamin D 50,000 IU every week for 1 month. Patricia Tran will follow-up for routine testing of Vitamin D, at least 2-3 times per year to avoid over-replacement.  - Vitamin D, Ergocalciferol, (DRISDOL) 1.25 MG (50000 UNIT) CAPS capsule;  Take 1 capsule (50,000 Units total) by mouth every 7 (seven) days.  Dispense: 4 capsule; Refill: 0  3. At risk for impaired metabolic function Patricia Tran was given approximately 15 minutes of impaired  metabolic function prevention counseling today. We discussed intensive lifestyle modifications today with an emphasis on specific nutrition and exercise instructions and strategies.   Repetitive spaced learning was employed today to elicit superior memory formation and behavioral change.  4. Obesity with current BMI 31.4 Patricia Tran is currently in the action stage of change. As such, her goal is to continue with weight loss efforts. She has agreed to the Category 2 Plan.   Behavioral modification strategies: better snacking choices and travel eating strategies.  Patricia Tran has agreed to follow-up with our clinic in 3 weeks. She was informed of the importance of frequent follow-up visits to maximize her success with intensive lifestyle modifications for her multiple health conditions.   Objective:   Blood pressure 111/75, pulse 92, temperature 98 F (36.7 C), temperature source Oral, height '5\' 3"'$  (1.6 m), weight 177 lb (80.3 kg), SpO2 99 %. Body mass index is 31.35 kg/m.  General: Cooperative, alert, well developed, in no acute distress. HEENT: Conjunctivae and lids unremarkable. Cardiovascular: Regular rhythm.  Lungs: Normal work of breathing. Neurologic: No focal deficits.   Lab Results  Component Value Date   CREATININE 0.81 12/01/2020   BUN 12 12/01/2020   NA 140 12/01/2020   K 4.6 12/01/2020   CL 105 12/01/2020   CO2 22 12/01/2020   Lab Results  Component Value Date   ALT 24 12/01/2020   AST 20 12/01/2020   ALKPHOS 102 12/01/2020   BILITOT 0.3 12/01/2020   Lab Results  Component Value Date   HGBA1C 5.5 12/01/2020   HGBA1C 5.4 08/11/2020   HGBA1C 5.4 10/01/2018   Lab Results  Component Value Date   INSULIN 9.0 12/01/2020   INSULIN 10.2 08/11/2020   Lab Results  Component Value  Date   TSH 1.480 08/25/2020   Lab Results  Component Value Date   CHOL 194 12/01/2020   HDL 38 (L) 12/01/2020   LDLCALC 136 (H) 12/01/2020   TRIG 109 12/01/2020   Lab Results  Component Value Date   VD25OH 57.0 12/01/2020   VD25OH 33.7 08/11/2020   Lab Results  Component Value Date   WBC 6.6 08/11/2020   HGB 12.6 08/11/2020   HCT 39.9 08/11/2020   MCV 87 08/11/2020   PLT 331 10/01/2018   No results found for: IRON, TIBC, FERRITIN  Attestation Statements:   Reviewed by clinician on day of visit: allergies, medications, problem list, medical history, surgical history, family history, social history, and previous encounter notes.   I, Trixie Dredge, am acting as transcriptionist for Dennard Nip, MD.  I have reviewed the above documentation for accuracy and completeness, and I agree with the above. -  Dennard Nip, MD

## 2021-01-01 DIAGNOSIS — E890 Postprocedural hypothyroidism: Secondary | ICD-10-CM | POA: Diagnosis not present

## 2021-01-01 DIAGNOSIS — C73 Malignant neoplasm of thyroid gland: Secondary | ICD-10-CM | POA: Diagnosis not present

## 2021-01-05 ENCOUNTER — Other Ambulatory Visit (HOSPITAL_COMMUNITY): Payer: Self-pay

## 2021-01-05 MED ORDER — LEVOTHYROXINE SODIUM 50 MCG PO TABS
50.0000 ug | ORAL_TABLET | Freq: Every day | ORAL | 11 refills | Status: DC
  Filled 2021-01-05: qty 30, 30d supply, fill #0
  Filled 2021-01-22: qty 30, 30d supply, fill #1
  Filled 2021-03-11: qty 30, 30d supply, fill #2
  Filled 2021-04-05: qty 30, 30d supply, fill #3
  Filled 2021-05-07: qty 30, 30d supply, fill #4
  Filled 2021-06-01: qty 30, 30d supply, fill #5
  Filled 2021-06-02: qty 90, 90d supply, fill #5

## 2021-01-08 ENCOUNTER — Other Ambulatory Visit (HOSPITAL_COMMUNITY): Payer: Self-pay

## 2021-01-11 ENCOUNTER — Other Ambulatory Visit (HOSPITAL_COMMUNITY): Payer: Self-pay

## 2021-01-12 ENCOUNTER — Ambulatory Visit (INDEPENDENT_AMBULATORY_CARE_PROVIDER_SITE_OTHER): Payer: 59 | Admitting: Family Medicine

## 2021-01-12 ENCOUNTER — Encounter (INDEPENDENT_AMBULATORY_CARE_PROVIDER_SITE_OTHER): Payer: Self-pay | Admitting: Family Medicine

## 2021-01-12 ENCOUNTER — Other Ambulatory Visit (HOSPITAL_COMMUNITY): Payer: Self-pay

## 2021-01-12 ENCOUNTER — Other Ambulatory Visit: Payer: Self-pay

## 2021-01-12 VITALS — BP 118/78 | HR 95 | Temp 98.1°F | Ht 63.0 in | Wt 172.0 lb

## 2021-01-12 DIAGNOSIS — Z6832 Body mass index (BMI) 32.0-32.9, adult: Secondary | ICD-10-CM | POA: Diagnosis not present

## 2021-01-12 DIAGNOSIS — E669 Obesity, unspecified: Secondary | ICD-10-CM | POA: Diagnosis not present

## 2021-01-12 DIAGNOSIS — Z9189 Other specified personal risk factors, not elsewhere classified: Secondary | ICD-10-CM | POA: Diagnosis not present

## 2021-01-12 DIAGNOSIS — E8881 Metabolic syndrome: Secondary | ICD-10-CM

## 2021-01-12 DIAGNOSIS — E559 Vitamin D deficiency, unspecified: Secondary | ICD-10-CM

## 2021-01-12 DIAGNOSIS — K5900 Constipation, unspecified: Secondary | ICD-10-CM

## 2021-01-12 MED ORDER — VITAMIN D (ERGOCALCIFEROL) 1.25 MG (50000 UNIT) PO CAPS
50000.0000 [IU] | ORAL_CAPSULE | ORAL | 0 refills | Status: DC
  Filled 2021-01-12 – 2021-01-22 (×2): qty 4, 28d supply, fill #0

## 2021-01-12 MED ORDER — TIRZEPATIDE 2.5 MG/0.5ML ~~LOC~~ SOAJ
2.5000 mg | SUBCUTANEOUS | 0 refills | Status: DC
  Filled 2021-01-12: qty 2, 28d supply, fill #0

## 2021-01-12 NOTE — Progress Notes (Signed)
[place]

## 2021-01-12 NOTE — Progress Notes (Signed)
Chief Complaint:   OBESITY Patricia Tran is here to discuss her progress with her obesity treatment plan along with follow-up of her obesity related diagnoses. Patricia Tran is on the Category 2 Plan and states she is following her eating plan approximately 60% of the time. Patricia Tran states she is doing 0 minutes 0 times per week.  Today's visit was #: 10 Starting weight: 185 lbs Starting date: 08/11/2020 Today's weight: 172 lbs Today's date: 01/12/2021 Total lbs lost to date: 13 Total lbs lost since last in-office visit: 5  Interim History: Patricia Tran has done very well with weight loss since her last visit, even with traveling. Her hunger is controlled and she is working on following her eating plan.  Subjective:   1. Insulin resistance Patricia Tran is doing well on Mounjaro, and decreased polyphagia was noted. She notes some increase in upper GI gas but overall tolerable.  2. Vitamin D deficiency Patricia Tran is taking Vit D regularly, and no side effects were noted.  3. Constipation, unspecified constipation type Patricia Tran notes decreased BM frequency since starting Mounjaro and with increased traveling, relieved with an enema.   4. At risk for nausea Patricia Tran is at risk for nausea due to [facility].  Assessment/Plan:   1. Insulin resistance [name] will continue to work on weight loss, exercise, and decreasing simple carbohydrates to help decrease the risk of diabetes. We will refill Mounjaro for 1 month. [name] agreed to follow-up with [place] as directed to closely monitor her progress.  - tirzepatide [facility]) 2.5 MG/0.5ML Pen; Inject 2.5 mg into the skin once a week.  Dispense: 2 mL; Refill: 0  2. Vitamin D deficiency Low Vitamin D level contributes to fatigue and are associated with obesity, breast, and colon cancer. We will refill prescription Vitamin D for 1 month. [name] will follow-up for routine testing of Vitamin D, at least 2-3 times per year to avoid over-replacement.  - Vitamin D, Ergocalciferol, (DRISDOL) 1.25 MG  (50000 UNIT) CAPS capsule; Take 1 capsule (50,000 Units total) by mouth every 7 (seven) days.  Dispense: 4 capsule; Refill: 0  3. Constipation, unspecified constipation type [name] agreed to start miralax 17 grams daily. She is to increase her water intake and she was informed that a decrease in bowel movement frequency is normal while losing weight, but stools should not be hard or painful. Orders and follow up as documented in patient record.   4. At risk for nausea Patricia Tran was given approximately 15 minutes of nausea prevention counseling today. Patricia Tran is at risk for nausea due to her new or current medication. She was encouraged to titrate her medication slowly, make sure to stay hydrated, eat smaller portions throughout the day, and avoid high fat meals.   5. Obesity with current BMI 30.5 Patricia Tran is currently in the action stage of change. As such, her goal is to continue with weight loss efforts. She has agreed to the Category 2 Plan.   Behavioral modification strategies: increasing water intake, increasing high fiber foods, and meal planning and cooking strategies.  Patricia Tran has agreed to follow-up with our clinic in 3 weeks. She was informed of the importance of frequent follow-up visits to maximize her success with intensive lifestyle modifications for her multiple health conditions.   Objective:   Blood pressure 118/78, pulse 95, temperature 98.1 F (36.7 C), height 5\' 3"  (1.6 m), weight 172 lb (78 kg), SpO2 98 %. Body mass index is 30.47 kg/m.  General: Cooperative, alert, well developed, in no acute distress. HEENT:  Conjunctivae and lids unremarkable. Cardiovascular: Regular rhythm.  Lungs: Normal work of breathing. Neurologic: No focal deficits.   Lab Results  Component Value Date   CREATININE 0.81 12/01/2020   BUN 12 12/01/2020   NA 140 12/01/2020   K 4.6 12/01/2020   CL 105 12/01/2020   CO2 22 12/01/2020   Lab Results  Component Value Date   ALT 24 12/01/2020    AST 20 12/01/2020   ALKPHOS 102 12/01/2020   BILITOT 0.3 12/01/2020   Lab Results  Component Value Date   HGBA1C 5.5 12/01/2020   HGBA1C 5.4 08/11/2020   HGBA1C 5.4 10/01/2018   Lab Results  Component Value Date   INSULIN 9.0 12/01/2020   INSULIN 10.2 08/11/2020   Lab Results  Component Value Date   TSH 1.480 08/25/2020   Lab Results  Component Value Date   CHOL 194 12/01/2020   HDL 38 (L) 12/01/2020   LDLCALC 136 (H) 12/01/2020   TRIG 109 12/01/2020   Lab Results  Component Value Date   VD25OH 57.0 12/01/2020   VD25OH 33.7 08/11/2020   Lab Results  Component Value Date   WBC 6.6 08/11/2020   HGB 12.6 08/11/2020   HCT 39.9 08/11/2020   MCV 87 08/11/2020   PLT 331 10/01/2018   No results found for: IRON, TIBC, FERRITIN  Attestation Statements:   Reviewed by clinician on day of visit: allergies, medications, problem list, medical history, surgical history, family history, social history, and previous encounter notes.   I, Trixie Dredge, am acting as transcriptionist for Dennard Nip, MD.  I have reviewed the above documentation for accuracy and completeness, and I agree with the above. -  Dennard Nip, MD

## 2021-01-22 ENCOUNTER — Other Ambulatory Visit (HOSPITAL_COMMUNITY): Payer: Self-pay

## 2021-01-22 ENCOUNTER — Encounter (INDEPENDENT_AMBULATORY_CARE_PROVIDER_SITE_OTHER): Payer: Self-pay | Admitting: Family Medicine

## 2021-01-22 MED ORDER — MELOXICAM 15 MG PO TABS
15.0000 mg | ORAL_TABLET | Freq: Every day | ORAL | 0 refills | Status: DC
  Filled 2021-01-22: qty 30, 30d supply, fill #0

## 2021-01-25 ENCOUNTER — Other Ambulatory Visit (HOSPITAL_COMMUNITY): Payer: Self-pay

## 2021-01-25 NOTE — Telephone Encounter (Signed)
Dr.Beasley 

## 2021-01-28 ENCOUNTER — Other Ambulatory Visit: Payer: Self-pay | Admitting: Family Medicine

## 2021-01-28 ENCOUNTER — Other Ambulatory Visit (HOSPITAL_COMMUNITY): Payer: Self-pay

## 2021-01-28 NOTE — Telephone Encounter (Signed)
Controlled substance refills are not appropriate.

## 2021-01-28 NOTE — Telephone Encounter (Signed)
Pt is sched for 11/7. Please decline

## 2021-01-29 ENCOUNTER — Other Ambulatory Visit (HOSPITAL_COMMUNITY): Payer: Self-pay

## 2021-02-02 ENCOUNTER — Other Ambulatory Visit: Payer: Self-pay

## 2021-02-02 ENCOUNTER — Ambulatory Visit (INDEPENDENT_AMBULATORY_CARE_PROVIDER_SITE_OTHER): Payer: 59 | Admitting: Family Medicine

## 2021-02-02 ENCOUNTER — Other Ambulatory Visit (HOSPITAL_COMMUNITY): Payer: Self-pay

## 2021-02-02 ENCOUNTER — Encounter (INDEPENDENT_AMBULATORY_CARE_PROVIDER_SITE_OTHER): Payer: Self-pay | Admitting: Family Medicine

## 2021-02-02 VITALS — BP 110/73 | HR 90 | Temp 97.9°F | Ht 63.0 in | Wt 168.0 lb

## 2021-02-02 DIAGNOSIS — E8881 Metabolic syndrome: Secondary | ICD-10-CM | POA: Diagnosis not present

## 2021-02-02 DIAGNOSIS — E559 Vitamin D deficiency, unspecified: Secondary | ICD-10-CM | POA: Diagnosis not present

## 2021-02-02 DIAGNOSIS — E669 Obesity, unspecified: Secondary | ICD-10-CM

## 2021-02-02 DIAGNOSIS — Z6832 Body mass index (BMI) 32.0-32.9, adult: Secondary | ICD-10-CM | POA: Diagnosis not present

## 2021-02-02 DIAGNOSIS — Z9189 Other specified personal risk factors, not elsewhere classified: Secondary | ICD-10-CM | POA: Diagnosis not present

## 2021-02-02 MED ORDER — TIRZEPATIDE 2.5 MG/0.5ML ~~LOC~~ SOAJ
2.5000 mg | SUBCUTANEOUS | 0 refills | Status: DC
  Filled 2021-02-02: qty 2, 28d supply, fill #0

## 2021-02-02 MED ORDER — VITAMIN D (ERGOCALCIFEROL) 1.25 MG (50000 UNIT) PO CAPS
50000.0000 [IU] | ORAL_CAPSULE | ORAL | 0 refills | Status: DC
  Filled 2021-02-02: qty 4, 28d supply, fill #0

## 2021-02-02 NOTE — Progress Notes (Signed)
Chief Complaint:   OBESITY Patricia Tran is here to discuss her progress with her obesity treatment plan along with follow-up of her obesity related diagnoses. Patricia Tran is on the Category 2 Plan and states she is following her eating plan approximately 60% of the time. Patricia Tran states she is doing 0 minutes 0 times per week.  Today's visit was #: 11 Starting weight: 185 lbs Starting date: 08/11/2020 Today's weight: 168 lbs Today's date: 02/02/2021 Total lbs lost to date: 17 Total lbs lost since last in-office visit: 4  Interim History: Patricia Tran continues to do well with weight loss. She notes her hunger is controlled and she is working on increasing protein and decreasing simple carbohydrates.  Subjective:   1. Insulin resistance Patricia Tran started [place], and she notes significant decreased polyphagia. She continues to do well with diet and exercise, and she denies nausea or vomiting.  2. Vitamin D deficiency Patricia Tran is stable on Vit D, but her level is not yet at goal.  3. At risk for impaired metabolic function Patricia Tran is at increased risk for impaired metabolic function if protein decreases.  Assessment/Plan:   1. Insulin resistance Patricia Tran will continue to work on weight loss, exercise, and decreasing simple carbohydrates to help decrease the risk of diabetes. We will refill Mounjaro for 1 month. Patricia Tran agreed to follow-up with [place] as directed to closely monitor her progress.  - tirzepatide [facility]) 2.5 MG/0.5ML Pen; Inject 2.5 mg into the skin once a week.  Dispense: 2 mL; Refill: 0  2. Vitamin D deficiency Low Vitamin D level contributes to fatigue and are associated with obesity, breast, and colon cancer. We will refill prescription Vitamin D for 1 month. Patricia Tran will follow-up for routine testing of Vitamin D, at least 2-3 times per year to avoid over-replacement.  - Vitamin D, Ergocalciferol, (DRISDOL) 1.25 MG (50000 UNIT) CAPS capsule; Take 1 capsule (50,000 Units total) by mouth every 7 (seven) days.   Dispense: 4 capsule; Refill: 0  3. At risk for impaired metabolic function Patricia Tran was given approximately 15 minutes of impaired  metabolic function prevention counseling today. We discussed intensive lifestyle modifications today with an emphasis on specific nutrition and exercise instructions and strategies.   Repetitive spaced learning was employed today to elicit superior memory formation and behavioral change.  4. Obesity with current BMI 29.9 Patricia Tran is currently in the action stage of change. As such, her goal is to continue with weight loss efforts. She has agreed to the Category 2 Plan.   Behavioral modification strategies: travel eating strategies.  Patricia Tran has agreed to follow-up with our clinic in 3 weeks. She was informed of the importance of frequent follow-up visits to maximize her success with intensive lifestyle modifications for her multiple health conditions.   Objective:   Blood pressure 110/73, pulse 90, temperature 97.9 F (36.6 C), height 5\' 3"  (1.6 m), weight 168 lb (76.2 kg), SpO2 98 %. Body mass index is 29.76 kg/m.  General: Cooperative, alert, well developed, in no acute distress. HEENT: Conjunctivae and lids unremarkable. Cardiovascular: Regular rhythm.  Lungs: Normal work of breathing. Neurologic: No focal deficits.   Lab Results  Component Value Date   CREATININE 0.81 12/01/2020   BUN 12 12/01/2020   NA 140 12/01/2020   K 4.6 12/01/2020   CL 105 12/01/2020   CO2 22 12/01/2020   Lab Results  Component Value Date   ALT 24 12/01/2020   AST 20 12/01/2020   ALKPHOS 102 12/01/2020   BILITOT 0.3 12/01/2020  Lab Results  Component Value Date   HGBA1C 5.5 12/01/2020   HGBA1C 5.4 08/11/2020   HGBA1C 5.4 10/01/2018   Lab Results  Component Value Date   INSULIN 9.0 12/01/2020   INSULIN 10.2 08/11/2020   Lab Results  Component Value Date   TSH 1.480 08/25/2020   Lab Results  Component Value Date   CHOL 194 12/01/2020   HDL 38 (L) 12/01/2020    LDLCALC 136 (H) 12/01/2020   TRIG 109 12/01/2020   Lab Results  Component Value Date   VD25OH 57.0 12/01/2020   VD25OH 33.7 08/11/2020   Lab Results  Component Value Date   WBC 6.6 08/11/2020   HGB 12.6 08/11/2020   HCT 39.9 08/11/2020   MCV 87 08/11/2020   PLT 331 10/01/2018   No results found for: IRON, TIBC, FERRITIN  Attestation Statements:   Reviewed by clinician on day of visit: allergies, medications, problem list, medical history, surgical history, family history, social history, and previous encounter notes.   I, Trixie Dredge, am acting as transcriptionist for Dennard Nip, MD.  I have reviewed the above documentation for accuracy and completeness, and I agree with the above. -  Dennard Nip, MD

## 2021-02-15 ENCOUNTER — Other Ambulatory Visit (HOSPITAL_COMMUNITY): Payer: Self-pay

## 2021-02-15 ENCOUNTER — Encounter (INDEPENDENT_AMBULATORY_CARE_PROVIDER_SITE_OTHER): Payer: Self-pay | Admitting: Family Medicine

## 2021-02-15 ENCOUNTER — Encounter: Payer: Self-pay | Admitting: Family Medicine

## 2021-02-15 ENCOUNTER — Other Ambulatory Visit: Payer: Self-pay

## 2021-02-15 ENCOUNTER — Ambulatory Visit (INDEPENDENT_AMBULATORY_CARE_PROVIDER_SITE_OTHER): Payer: 59 | Admitting: Family Medicine

## 2021-02-15 VITALS — BP 101/61 | HR 91 | Temp 98.4°F | Ht 63.0 in | Wt 174.0 lb

## 2021-02-15 DIAGNOSIS — Z23 Encounter for immunization: Secondary | ICD-10-CM | POA: Diagnosis not present

## 2021-02-15 DIAGNOSIS — C73 Malignant neoplasm of thyroid gland: Secondary | ICD-10-CM

## 2021-02-15 DIAGNOSIS — F419 Anxiety disorder, unspecified: Secondary | ICD-10-CM

## 2021-02-15 DIAGNOSIS — E7849 Other hyperlipidemia: Secondary | ICD-10-CM | POA: Diagnosis not present

## 2021-02-15 DIAGNOSIS — E669 Obesity, unspecified: Secondary | ICD-10-CM | POA: Diagnosis not present

## 2021-02-15 DIAGNOSIS — R1310 Dysphagia, unspecified: Secondary | ICD-10-CM | POA: Diagnosis not present

## 2021-02-15 DIAGNOSIS — Z6832 Body mass index (BMI) 32.0-32.9, adult: Secondary | ICD-10-CM

## 2021-02-15 DIAGNOSIS — E890 Postprocedural hypothyroidism: Secondary | ICD-10-CM | POA: Diagnosis not present

## 2021-02-15 DIAGNOSIS — F330 Major depressive disorder, recurrent, mild: Secondary | ICD-10-CM | POA: Diagnosis not present

## 2021-02-15 MED ORDER — BUPROPION HCL ER (XL) 300 MG PO TB24
300.0000 mg | ORAL_TABLET | Freq: Every day | ORAL | 1 refills | Status: DC
  Filled 2021-02-15 – 2021-05-07 (×2): qty 90, 90d supply, fill #0
  Filled 2021-08-10: qty 90, 90d supply, fill #1

## 2021-02-15 MED ORDER — LORAZEPAM 0.5 MG PO TABS
0.5000 mg | ORAL_TABLET | Freq: Every day | ORAL | 1 refills | Status: DC | PRN
  Filled 2021-02-15 – 2021-03-21 (×2): qty 90, 90d supply, fill #0
  Filled 2021-08-10: qty 90, 90d supply, fill #1

## 2021-02-15 NOTE — Progress Notes (Signed)
This visit occurred during the SARS-CoV-2 public health emergency.  Safety protocols were in place, including screening questions prior to the visit, additional usage of staff PPE, and extensive cleaning of exam room while observing appropriate contact time as indicated for disinfecting solutions.    Patient ID: Patricia Tran, female  DOB: 28-Jun-1970, 50 y.o.   MRN: [id number] Patient Care Team    Relationship Specialty Notifications Start End  Ma Hillock, DO PCP - General Family Medicine  05/11/20   Shamleffer, Melanie Crazier, MD Consulting Physician Endocrinology  08/31/20   Lindon Romp, MD Referring Physician General Surgery  08/31/20     Chief Complaint  Patient presents with   Anxiety    CMC; pt is fasting    Subjective: Patricia Tran is a 50 y.o.  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 She has been prescribed Wellbutrin 300 mg daily and Ativan 0.5 mg daily as needed. She feels her symptoms are well controlled .  Thyroid cancer: Pt reports she is overall feeling well and healed well s/p her thyroidectomy for thyroid cancer. She is compliant with levothy 50 mcg qd. She has noticed a change in her swallow mechanism. She reports occasionally she will have a pill get caught in her throat. She denies choking or aspiration of food or liquid, but since her surgery has noticed swallowing takes a notable focus and effort.   Depression screen [facility] 2/9 02/15/2021 08/31/2020 08/11/2020  Decreased Interest 0 0 2  Down, Depressed, Hopeless 0 1 3  PHQ - 2 Score 0 1 5  Altered sleeping 0 2 0  Tired, decreased energy 0 1 0  Change in appetite 0 1 0  Feeling bad or failure about yourself  1 2 0  Trouble concentrating 0 0 0  Moving slowly or fidgety/restless 0 0 0  Suicidal thoughts 0 0 0  PHQ-9 Score 1 7 5   Difficult doing work/chores - - Not difficult at all    GAD 7 : Generalized Anxiety Score 02/15/2021 08/31/2020  Nervous, Anxious, on Edge 0 2  Control/stop worrying 0 2  Worry too much - different things 0 1  Trouble relaxing 0 0  Restless 0 0  Easily annoyed or irritable 0 1  Afraid - awful might happen 0 0  Total GAD 7 Score 0 6     Immunization History  Administered Date(s) Administered   Influenza,inj,Quad PF,6+ Mos 02/15/2021   PFIZER Comirnaty(Gray Top)Covid-19 Tri-Sucrose Vaccine 06/05/2020   PFIZER(Purple Top)SARS-COV-2 Vaccination 11/07/2019, 11/28/2019   Tdap 08/31/2020     Past Medical History:  Diagnosis Date   Allergy    Anemia    Anxiety    Asthma    Constipation    Depression    Diverticulitis    Fibroids 2017   left sided uterine leiomyoma    Gallbladder disease    GERD (gastroesophageal reflux disease)    Goiter    Hyperlipidemia    Joint pain    Kidney stone 08/31/2016   incidental find on CT right renal calculus   Lactose intolerance    Lump in neck    Other fatigue    Ovarian cyst 08/31/2016   1.5 cm left ovarian cyst   SOB (shortness of breath) on exertion    Swallowing difficulty    Thyroid nodule 03/2013   2.6x1.9x1.8 cm on last US thyroid 02/2015- "stable".  lg mixed schogenic nodule  left thyroid. "Follicular lesion of unknown significance"   Allergies  Allergen Reactions   Codeine Itching   Past Surgical History:  Procedure Laterality Date   BIOPSY THYROID  06/2020   CHOLECYSTECTOMY  2011   POST OP ITCHING   TRANSAXILLERY ENDOSCOPIC LEFT THYROID LOBECTOMY WITH ROBOTIC ASSISTANCE Left 08/2020   scheduled end of May 2022.nodule- high concern for malignancy   Family History  Problem Relation Age of Onset   Early death Mother    Kidney disease Mother    Alcohol abuse Father    Cancer Father        oral HPV   Alcoholism Father    Obesity Father    Colitis Father    Colon polyps Father    Diabetes Maternal Grandmother    Alcohol abuse Maternal Grandfather    Heart disease  Maternal Grandfather    Heart attack Paternal Grandmother    Heart attack Paternal Grandfather    Colon cancer Neg Hx    Esophageal cancer Neg Hx    Stomach cancer Neg Hx    Social History   Social History Narrative   Marital status/children/pets: married, 1 child   Education/employment: BA. Building surveyor.    Safety:      -smoke alarm in the home:Yes     - wears seatbelt: Yes     - Feels safe in their relationships: Yes    Allergies as of 02/15/2021       Reactions   Codeine Itching        Medication List        Accurate as of February 15, 2021  8:31 AM. If you have any questions, ask your nurse or doctor.          STOP taking these medications    doxycycline 100 MG capsule Commonly known as: VIBRAMYCIN Stopped by: Howard Pouch, DO   fluticasone 50 MCG/ACT nasal spray Commonly known as: FLONASE Stopped by: Howard Pouch, DO   meclizine 25 MG tablet Commonly known as: ANTIVERT Stopped by: Howard Pouch, DO       TAKE these medications    buPROPion 300 MG 24 hr tablet Commonly known as: WELLBUTRIN XL Take 1 tablet (300 mg total) by mouth daily.   dicyclomine 10 MG capsule Commonly known as: BENTYL Take 2 capsules (20 mg total) by mouth 3 (three) times daily as needed for spasms.   LORazepam 0.5 MG tablet Commonly known as: ATIVAN Take 1 tablet (0.5 mg total) by mouth daily as needed for anxiety.   meloxicam 15 MG tablet Commonly known as: MOBIC Take 1 tablet (15 mg total) by mouth daily.   Mounjaro 2.5 MG/0.5ML Pen Generic drug: tirzepatide Inject 2.5 mg into the skin once a week.   multivitamin tablet Take 1 tablet by mouth daily.   polyethylene glycol 17 g packet Commonly known as: MIRALAX / GLYCOLAX Take 17 g by mouth daily.   Synthroid 50 MCG tablet Generic drug: levothyroxine Take 1 tablet (50 mcg total) by mouth daily. Take on an empty stomach with a glass of water at least 30-60 minutes before breakfast. What changed:  Another medication with the same name was removed. Continue taking this medication, and follow the directions you see here. Changed by: Howard Pouch, DO   Vitamin D (Ergocalciferol) 1.25 MG (50000 UNIT) Caps capsule Commonly known as: DRISDOL Take 1 capsule (50,000 Units total) by mouth every 7 (seven) days.        All past medical history, surgical history,  allergies, family history, immunizations andmedications were updated in the EMR today and reviewed under the history and medication portions of their EMR.      US Thyroid Biopsy Result Date: 06/15/2020 IMPRESSION: Technically successful ultrasound guided fine needle aspiration of indeterminate TR4 nodule within the mid aspect of the left lobe of the thyroid (labeled #2). Electronically Signed   By: Sandi Mariscal M.D.   On: 06/15/2020 14:10    ROS: 14 pt review of systems performed and negative (unless mentioned in an HPI)  Objective: BP 101/61   Pulse 91   Temp 98.4 F (36.9 C) (Oral)   Ht 5\' 3"  (1.6 m)   Wt 174 lb (78.9 kg)   SpO2 100%   BMI 30.82 kg/m  Gen: Afebrile. No acute distress. Nontoxic, pleasant female.  HENT: AT. [place]. No cough or hoarseness.  Eyes:Pupils Equal Round Reactive to light, Extraocular movements intact,  Conjunctiva without redness, discharge or icterus. Neck/lymp/endocrine: Supple,no lymphadenopathy, no thyromegaly.  CV: RRR no murmur Chest: CTAB, no wheeze or crackles Neuro:  Normal gait. PERLA. EOMi. Alert. Oriented x3 Psych: Normal affect, dress and demeanor. Normal speech. Normal thought content and judgment..    No results found.  Assessment/plan: Hessie Varone is a 50 y.o. 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  Continue  Wellbutrin 300 mg daily Continue   Ativan 0.5 mg daily as needed [place] controlled substance database reviewed 02/15/21 Follow-up 5.5 months(can be cpe). Patient is aware will need face-to-face  visit prior to any refills of controlled substances.   Thyroid cancer/dysphagia: Swallow mechanism has changed since her thyroidectomy. It is causing her discomfort.  Discussed referral to speech therapy and she is agreeable to this today.   Influenza vaccine administered today.   Return in about 7 months (around 09/01/2021) for CPE (30 min), CMC (30 min).   Orders Placed This Encounter  Procedures   Flu Vaccine QUAD 6+ mos PF IM (Fluarix Quad PF)   Meds ordered this encounter  Medications   buPROPion (WELLBUTRIN XL) 300 MG 24 hr tablet    Sig: Take 1 tablet (300 mg total) by mouth daily.    Dispense:  90 tablet    Refill:  1   LORazepam (ATIVAN) 0.5 MG tablet    Sig: Take 1 tablet (0.5 mg total) by mouth daily as needed for anxiety.    Dispense:  90 tablet    Refill:  1    Referral Orders  No referral(s) requested today     Electronically signed by: Howard Pouch, [place]

## 2021-02-15 NOTE — Patient Instructions (Signed)
   Great to see you today.  I have refilled the medication(s) we provide.   If labs were collected, we will inform you of lab results once received either by echart message or telephone call.   - echart message- for normal results that have been seen by the patient already.   - telephone call: abnormal results or if patient has not viewed results in their echart.   Next appt after 09/01/2021 > this is your physical - please schedule. Fasting labs will be due.

## 2021-02-16 ENCOUNTER — Encounter: Payer: Self-pay | Admitting: Family Medicine

## 2021-02-16 DIAGNOSIS — N393 Stress incontinence (female) (male): Secondary | ICD-10-CM

## 2021-02-16 NOTE — Addendum Note (Signed)
Addended by: Kavin Leech on: 02/16/2021 01:20 PM   Modules accepted: Orders

## 2021-02-16 NOTE — Telephone Encounter (Signed)
Please advise if this is okay?

## 2021-02-16 NOTE — Telephone Encounter (Signed)
Ok to place referral.

## 2021-02-16 NOTE — Addendum Note (Signed)
Addended by: Kavin Leech on: 02/16/2021 11:36 AM   Modules accepted: Orders

## 2021-02-17 ENCOUNTER — Ambulatory Visit: Payer: 59 | Attending: Family Medicine | Admitting: Physical Therapy

## 2021-02-17 ENCOUNTER — Encounter: Payer: Self-pay | Admitting: Physical Therapy

## 2021-02-17 ENCOUNTER — Other Ambulatory Visit: Payer: Self-pay

## 2021-02-17 DIAGNOSIS — R279 Unspecified lack of coordination: Secondary | ICD-10-CM | POA: Insufficient documentation

## 2021-02-17 DIAGNOSIS — M6281 Muscle weakness (generalized): Secondary | ICD-10-CM | POA: Diagnosis not present

## 2021-02-17 DIAGNOSIS — N393 Stress incontinence (female) (male): Secondary | ICD-10-CM | POA: Insufficient documentation

## 2021-02-17 DIAGNOSIS — R293 Abnormal posture: Secondary | ICD-10-CM | POA: Diagnosis not present

## 2021-02-17 NOTE — Patient Instructions (Addendum)
Pelvic Floor Strengthening:  Do 3x10 Kegels throughout the day  Do 4M76 Quick Flicks throughout the day (contract and relax quickly back to back) Do 3x10 contractions with a 5 second hold each time *You can put a hand on your abdomen and one on your bottom when doing these. There shouldn't be large movements here when trying to do Kegels. Kegels should be isolated to the pelvic floor *Do one round of 10 at morning, lunch, and evening.   Bladder Irritants  Certain foods and beverages can be irritating to the bladder.  Avoiding these irritants may decrease your symptoms of urinary urgency, frequency or bladder pain.  Even reducing your intake can help with your symptoms.  Not everyone is sensitive to all bladder irritants, so you may consider focusing on one irritant at a time, removing or reducing your intake of that irritant for 7-10 days to see if this change helps your symptoms.  Water intake is also very important.  Below is a list of bladder irritants.  Drinks: alcohol, carbonated beverages, caffeinated beverages such as coffee and tea, drinks with artificial sweeteners, citrus juices, apple juice, tomato juice  Foods: tomatoes and tomato based foods, spicy food, sugar and artificial sweeteners, vinegar, chocolate, raw onion, apples, citrus fruits, pineapple, cranberries, tomatoes, strawberries, plums, peaches, cantaloupe  Other: acidic urine (too concentrated) - see water intake info below  Substitutes you can try that are NOT irritating to the bladder: cooked onion, pears, papayas, sun-brewed decaf teas, watermelons, non-citrus herbal teas, apricots, kava and low-acid instant drinks ([place]um).    WATER INTAKE: Remember to drink lots of water (aim for fluid intake of half your body weight with 2/3 of fluids being water).  You may be limiting fluids due to fear of leakage, but this can actually worsen urgency symptoms due to highly concentrated urine.  Water helps balance the pH of your  urine so it doesn't become too acidic - acidic urine is a bladder irritant!   THE KNACK  The Knack is a strategy you may use to help to reduce or prevent leakage or passing of urine, gas or feces during an activity that causes downward force on the pelvic floor muscles.    Activities that can cause downward pressure on the pelvic floor muscles include coughing, sneezing, laughing, bending, lifting, and transitioning from different body positions such as from laying down to sitting up and sitting to standing.  To perform The Knack, consciously squeeze and lift your pelvic floor muscles to perform a strong, well-timed pelvic muscle contraction BEFORE AND DURING these activities above.  As your contraction gets more coordinated and your muscles get stronger, you will become more effective in controlling your experience of incontinence or gas passing during these activities.

## 2021-02-17 NOTE — Therapy (Signed)
[place] @ [place], [place], [address] Phone: [phone]   Fax:  [phone]  Physical Therapy Evaluation  Patient Details  Name: Patricia Tran MRN: [id number] Date of Birth: September 24, 1970 Referring Provider (PT): Ma Hillock, DO   Encounter Date: 02/17/2021   PT End of Session - 02/17/21 1010     Visit Number 1    Date for PT Re-Evaluation 05/20/21    Authorization Type [employer] choice    Authorization - Number of Visits 25    PT Start Time 0930    PT Stop Time 1009    PT Time Calculation (min) 39 min    Activity Tolerance Patient tolerated treatment well    Behavior During Therapy [facility] for tasks assessed/performed             Past Medical History:  Diagnosis Date   Allergy    Anemia    Anxiety    Asthma    Constipation    Depression    Diverticulitis    Fibroids 2017   left sided uterine leiomyoma    Gallbladder disease    GERD (gastroesophageal reflux disease)    Goiter    Hyperlipidemia    Joint pain    Kidney stone 08/31/2016   incidental find on CT right renal calculus   Lactose intolerance    Lump in neck    Other fatigue    Ovarian cyst 08/31/2016   1.5 cm left ovarian cyst   SOB (shortness of breath) on exertion    Swallowing difficulty    Thyroid nodule 03/2013   2.6x1.9x1.8 cm on last US thyroid 02/2015- "stable".  lg mixed schogenic nodule left thyroid. "Follicular lesion of unknown significance"    Past Surgical History:  Procedure Laterality Date   BIOPSY THYROID  06/2020   CHOLECYSTECTOMY  2011   POST OP ITCHING   TRANSAXILLERY ENDOSCOPIC LEFT THYROID LOBECTOMY WITH ROBOTIC ASSISTANCE Left 08/2020   scheduled end of May 2022.nodule- high concern for malignancy    There were no vitals filed for this visit.    Subjective Assessment - 02/17/21 0933     Subjective Pt has been having stress incontinence for years, with coughing/sneezing/laughing and strong  urge regardless of size of void. Pt reports consistently leaking with stressors and not frequently with urge. Wears one panty liner per day, may not have leakage if there are no stressors that day. Had half throyid removed due to cancer, continued to be followed for this.    Pertinent History thyroid cancer, no radiation or chemo    How long can you sit comfortably? no limits    How long can you stand comfortably? no limits    How long can you walk comfortably? no limits    Patient Stated Goals to leak less    Currently in Pain? No/denies                [facility] PT Assessment - 02/17/21 0001       Assessment   Medical Diagnosis N39.3 (ICD-10-CM) - Urinary, incontinence, stress female    Referring Provider (PT) Raoul Pitch, Renee A, DO    Onset Date/Surgical Date --   years   Prior Therapy yes-for back pain; PFPT      Precautions   Precautions Other (comment)    Precaution Comments had thyroid cancer and being monitored      Restrictions   Weight Bearing Restrictions No      Balance Screen  Has the patient fallen in the past 6 months No    Has the patient had a decrease in activity level because of a fear of falling?  No    Is the patient reluctant to leave their home because of a fear of falling?  No      Home Ec[employer] residence    Living Arrangements Spouse/significant other;Children      Prior Function   Level of Independence Independent      Cognition   Overall Cognitive Status Within Functional Limits for tasks assessed      Sensation   Light Touch Appears Intact      Coordination   Gross Motor Movements are Fluid and Coordinated Yes    Fine Motor Movements are Fluid and Coordinated Yes      Posture/Postural Control   Posture/Postural Control Postural limitations    Postural Limitations Rounded Shoulders;Posterior pelvic tilt      ROM / Strength   AROM / PROM / Strength AROM;Strength      AROM   Overall AROM Comments bil  thoracic and lumbar spine limited by 25% in rotation all others [facility]      Strength   Overall Strength Comments bil hips 4+/5      Flexibility   Soft Tissue Assessment /Muscle Length yes   bil adductors limited by 25%                       Objective measurements completed on examination: See above findings.     Pelvic Floor Special Questions - 02/17/21 0001     Prior Pelvic/Prostate Exam Yes   normal   Are you Pregnant or attempting pregnancy? No    Prior Pregnancies Yes    Number of Pregnancies 1    Number of Vaginal Deliveries 1    Any difficulty with labor and deliveries No    Episiotomy Performed Yes    Currently Sexually Active Yes    Is this Painful No    History of sexually transmitted disease No    Marinoff Scale no problems    Urinary Leakage Yes    How often few times a week    Pad use 1 panty liner    Activities that cause leaking Coughing;With strong urge;Sneezing;Laughing    Urinary urgency No    Urinary frequency no    Fecal incontinence No    Fluid intake not enough    Caffeine beverages yes- 1 glass of tea per day (has been cutting down)    Falling out feeling (prolapse) No    Pelvic Floor Internal Exam patient identified and patient confirms consent for PT to perform internal soft tissue work and muscle strength and integrity assessment    Exam Type Vaginal    Sensation WFL    Palpation no TTP    Strength weak squeeze, no lift    Strength # of reps 3    Strength # of seconds 2    Tone decreased              No emotional/communication barriers or cognitive limitation. Patient is motivated to learn. Patient understands and agrees with treatment goals and plan. PT explains patient will be examined in standing, sitting, and lying down to see how their muscles and joints work. When they are ready, they will be asked to remove their underwear so PT can examine their perineum. The patient is also given the option of providing their  own  chaperone as one is not provided in our facility. The patient also has the right and is explained the right to defer or refuse any part of the evaluation or treatment including the internal exam. With the patient's consent, PT will use one gloved finger to gently assess the muscles of the pelvic floor, seeing how well it contracts and relaxes and if there is muscle symmetry. After, the patient will get dressed and PT and patient will discuss exam findings and plan of care. PT and patient discuss plan of care, schedule, attendance policy and HEP activities.            PT Short Term Goals - 02/17/21 1120       PT SHORT TERM GOAL #1   Title Pt to be I with HEP    Time 6    Period Weeks    Status New    Target Date 03/31/21      PT SHORT TERM GOAL #2   Title pt to demonstrate at least 3/5 pelvic floor strength to decrease urinary incontinence symptoms    Baseline 2/5    Time 6    Period Weeks    Status New    Target Date 03/31/21      PT SHORT TERM GOAL #3   Title pt to report no more than 2 urinary leaks per week to decrease symptoms and improve QOL.    Baseline multiple times per week    Time 6    Period Weeks    Status New    Target Date 03/31/21               PT Long Term Goals - 02/17/21 1121       PT LONG TERM GOAL #1   Title Pt to be I with advanced HEP    Time 3    Period Months    Status New    Target Date 05/20/21      PT LONG TERM GOAL #2   Title pt to demonstrate at least 4/5 pelvic floor strength to decrease urinary incontinence symptoms    Baseline 2/5    Time 3    Period Months    Status New    Target Date 05/20/21      PT LONG TERM GOAL #3   Title pt to report no more than 1 urinary leak per month to decrease symptoms and improve QOL.    Time 3    Period Months    Status New    Target Date 05/20/21      PT LONG TERM GOAL #4   Title pt to demonstrate improved coordination with breathing mechanics and pelvic floor contract/relax with  activities at least 75% of the time to decrease strain on pelvic floor.    Time 3    Period Months    Target Date 05/20/21                    Plan - 02/17/21 1011     Clinical Impression Statement Pt is 50 yo female presenting to clinic with chronic stress urinary incontinence with laughing, sneezing, coughing, and strong urge. Pt reports she does drink at least one glass of tea per day, has a h/o thyroid CA with hald removed in June 2022 now being followed by MD, depression and anxiety. Pt has one had one vaginal birth with episiotomy. Pt denied all other symptoms and does not feel like she urinates more than every  2 hours, wears one panty liner per day just in case and sometimes this is dry at end of the day. Pt reports she feels she leaks a few times per week. Pt found to have mild weak bil hips, mildy decreased trunk mobility and hip mobility, no TTP externally or internally, and pt did consent to internal vaginal assessment for pelvic floor. Pt found to have profound weakness throughout grossly 2/5 internally, and decreased endurnace and coordination as well. Pt educated on all of this and given handouts for bladder irritants, knack method, HEP.    Personal Factors and Comorbidities Time since onset of injury/illness/exacerbation;Comorbidity 1    Comorbidities one vaginal birth    Examination-Activity Limitations Continence    Examination-Participation Restrictions Community Activity;Shop    Stability/Clinical Decision Making Stable/Uncomplicated    Clinical Decision Making Low    Rehab Potential Good    PT Frequency 1x / week    PT Duration Other (comment)   10   PT Treatment/Interventions ADLs/Self Care Home Management;Functional mobility training;Therapeutic activities;Therapeutic exercise;Neuromuscular re-education;Manual techniques;Patient/family education;Taping;Scar mobilization;Passive range of motion;Energy conservation;Aquatic Therapy    PT Next Visit Plan go over all  hanouts, breathing mechanics    Consulted and Agree with Plan of Care Patient             Patient will benefit from skilled therapeutic intervention in order to improve the following deficits and impairments:  Decreased coordination, Decreased endurance, Impaired tone, Impaired flexibility, Improper body mechanics, Postural dysfunction, Decreased strength, Decreased mobility  Visit Diagnosis: Muscle weakness (generalized) - Plan: PT plan of care cert/re-cert  Lack of coordination - Plan: PT plan of care cert/re-cert  Abnormal posture - Plan: PT plan of care cert/re-cert     Problem List Patient Active Problem List   Diagnosis Date Noted   S/P thyroidectomy 09/22/2020   Thyroid cancer ([place]) 09/22/2020   Class 1 obesity with serious comorbidity and body mass index (BMI) of 32.0 to 32.9 in adult 09/15/2020   Fibroid 08/31/2020   Other hyperlipidemia 08/26/2020   Insulin resistance 08/26/2020   Vitamin D deficiency 08/26/2020   Anxiety 06/18/2019   Mild episode of recurrent major depressive disorder ([place]) 05/13/2019    Stacy Gardner, PT, DPT 02/18/2211:10 PM   [place] @ [place], [place], [address] Phone: [phone]   Fax:  [phone]  Name: Patricia Tran MRN: [id number] Date of Birth: 04/10/1971

## 2021-02-22 ENCOUNTER — Telehealth: Payer: Self-pay | Admitting: Family Medicine

## 2021-02-22 ENCOUNTER — Encounter: Payer: Self-pay | Admitting: Family Medicine

## 2021-02-22 ENCOUNTER — Other Ambulatory Visit: Payer: Self-pay

## 2021-02-22 ENCOUNTER — Ambulatory Visit: Payer: 59 | Admitting: Physical Therapy

## 2021-02-22 DIAGNOSIS — M6281 Muscle weakness (generalized): Secondary | ICD-10-CM

## 2021-02-22 DIAGNOSIS — R279 Unspecified lack of coordination: Secondary | ICD-10-CM

## 2021-02-22 DIAGNOSIS — N393 Stress incontinence (female) (male): Secondary | ICD-10-CM | POA: Diagnosis not present

## 2021-02-22 DIAGNOSIS — R293 Abnormal posture: Secondary | ICD-10-CM | POA: Diagnosis not present

## 2021-02-22 DIAGNOSIS — C73 Malignant neoplasm of thyroid gland: Secondary | ICD-10-CM

## 2021-02-22 DIAGNOSIS — E890 Postprocedural hypothyroidism: Secondary | ICD-10-CM

## 2021-02-22 DIAGNOSIS — R1310 Dysphagia, unspecified: Secondary | ICD-10-CM

## 2021-02-22 NOTE — Therapy (Signed)
[place] @ [place] [place], [place], [address] Phone: [phone]   Fax:  [phone]  Physical Therapy Treatment  Patient Details  Name: Patricia Tran MRN: [id number] Date of Birth: 04-21-1970 Referring Provider (PT): Ma Hillock, DO   Encounter Date: 02/22/2021   PT End of Session - 02/22/21 1229     Visit Number 2    Date for PT Re-Evaluation 05/20/21    Authorization Type UMR choice    Authorization - Number of Visits 25    PT Start Time 5852    PT Stop Time 1228    PT Time Calculation (min) 43 min    Activity Tolerance Patient tolerated treatment well    Behavior During Therapy [facility] for tasks assessed/performed             Past Medical History:  Diagnosis Date   Allergy    Anemia    Anxiety    Asthma    Constipation    Depression    Diverticulitis    Fibroids 2017   left sided uterine leiomyoma    Gallbladder disease    GERD (gastroesophageal reflux disease)    Goiter    Hyperlipidemia    Joint pain    Kidney stone 08/31/2016   incidental find on CT right renal calculus   Lactose intolerance    Lump in neck    Other fatigue    Ovarian cyst 08/31/2016   1.5 cm left ovarian cyst   SOB (shortness of breath) on exertion    Swallowing difficulty    Thyroid nodule 03/2013   2.6x1.9x1.8 cm on last US thyroid 02/2015- "stable".  lg mixed schogenic nodule left thyroid. "Follicular lesion of unknown significance"    Past Surgical History:  Procedure Laterality Date   BIOPSY THYROID  06/2020   CHOLECYSTECTOMY  2011   POST OP ITCHING   TRANSAXILLERY ENDOSCOPIC LEFT THYROID LOBECTOMY WITH ROBOTIC ASSISTANCE Left 08/2020   scheduled end of May 2022.nodule- high concern for malignancy    There were no vitals filed for this visit.   Subjective Assessment - 02/22/21 1148     Subjective Pt reports she has had an increase in leakage since last session as she has been having a lot of  coughing and sneezing.    Pertinent History thyroid cancer, no radiation or chemo    How long can you sit comfortably? no limits    How long can you stand comfortably? no limits    How long can you walk comfortably? no limits    Currently in Pain? No/denies                    No emotional/communication barriers or cognitive limitation. Patient is motivated to learn. Patient understands and agrees with treatment goals and plan. PT explains patient will be examined in standing, sitting, and lying down to see how their muscles and joints work. When they are ready, they will be asked to remove their underwear so PT can examine their perineum. The patient is also given the option of providing their own chaperone as one is not provided in our facility. The patient also has the right and is explained the right to defer or refuse any part of the evaluation or treatment including the internal exam. With the patient's consent, PT will use one gloved finger to gently assess the muscles of the pelvic floor, seeing how well it contracts and relaxes and if there is muscle  symmetry. After, the patient will get dressed and PT and patient will discuss exam findings and plan of care. PT and patient discuss plan of care, schedule, attendance policy and HEP activities.         Pelvic Floor Special Questions - 02/22/21 0001     Pelvic Floor Internal Exam patient identified and patient confirms consent for PT to perform internal soft tissue work and muscle strength and integrity assessment    Exam Type Vaginal    Sensation WFL    Palpation no TTP    Strength fair squeeze, definite lift   with quick release   Strength # of reps 5    Strength # of seconds 5    Tone decreased               [facility] Adult PT Treatment/Exercise - 02/22/21 0001       Self-Care   Self-Care Other Self-Care Comments    Other Self-Care Comments  pt educated on kegels for home with vaginal weights as pt pruchased these  and urge drill.      Neuro Re-ed    Neuro Re-ed Details  Pt directed in NMRE with internal pelvic floor treatment with 3x10 contractions; 7D22 quick flicks; and 2x5 contract and holds for 5s and x5 hold for 3s. Quick release technique needed to improve isolation of pelvic floor and improved strength/activation of muscles                     PT Education - 02/22/21 1228     Education Details Pt educated on techniques with vaginal weights for home, and improving kegel technique for improved strengthening.    Person(s) Educated Patient    Methods Explanation;Demonstration;Verbal cues;Tactile cues    Comprehension Verbalized understanding;Returned demonstration              PT Short Term Goals - 02/17/21 1120       PT SHORT TERM GOAL #1   Title Pt to be I with HEP    Time 6    Period Weeks    Status New    Target Date 03/31/21      PT SHORT TERM GOAL #2   Title pt to demonstrate at least 3/5 pelvic floor strength to decrease urinary incontinence symptoms    Baseline 2/5    Time 6    Period Weeks    Status New    Target Date 03/31/21      PT SHORT TERM GOAL #3   Title pt to report no more than 2 urinary leaks per week to decrease symptoms and improve QOL.    Baseline multiple times per week    Time 6    Period Weeks    Status New    Target Date 03/31/21               PT Long Term Goals - 02/17/21 1121       PT LONG TERM GOAL #1   Title Pt to be I with advanced HEP    Time 3    Period Months    Status New    Target Date 05/20/21      PT LONG TERM GOAL #2   Title pt to demonstrate at least 4/5 pelvic floor strength to decrease urinary incontinence symptoms    Baseline 2/5    Time 3    Period Months    Status New    Target Date 05/20/21  PT LONG TERM GOAL #3   Title pt to report no more than 1 urinary leak per month to decrease symptoms and improve QOL.    Time 3    Period Months    Status New    Target Date 05/20/21      PT LONG  TERM GOAL #4   Title pt to demonstrate improved coordination with breathing mechanics and pelvic floor contract/relax with activities at least 75% of the time to decrease strain on pelvic floor.    Time 3    Period Months    Target Date 05/20/21                   Plan - 02/22/21 1229     Clinical Impression Statement Pt presents to clinic reporting worsening of leakage symptoms with having a lot of coughing and sneezing this past week and lots of leakage per pt. Pt reported she didn't feel she was doing kegels correctly at home and agreeable to internal today to improve feedback. Pt benefited from NMRE internally for improved activation and isolation of pelvic floor muscles as pt initially demonstrated 2/5 strength and various compensatory strategies with use of abs or glutes. Pt able to improve with quick release to 3/5 and improved holds and isolation. Pt reported she had better understanding of how to complete this at home and denied all question at end of session. Pt would benefit from continued PT to further address urinary leakage symptoms.    Personal Factors and Comorbidities Time since onset of injury/illness/exacerbation;Comorbidity 1    Comorbidities one vaginal birth    Examination-Activity Limitations Continence    Examination-Participation Restrictions Community Activity;Shop    Stability/Clinical Decision Making Stable/Uncomplicated    Rehab Potential Good    PT Frequency 1x / week    PT Duration Other (comment)   10   PT Treatment/Interventions ADLs/Self Care Home Management;Functional mobility training;Therapeutic activities;Therapeutic exercise;Neuromuscular re-education;Manual techniques;Patient/family education;Taping;Scar mobilization;Passive range of motion;Energy conservation;Aquatic Therapy    PT Next Visit Plan go over all hanouts, breathing mechanics    Consulted and Agree with Plan of Care Patient             Patient will benefit from skilled  therapeutic intervention in order to improve the following deficits and impairments:  Decreased coordination, Decreased endurance, Impaired tone, Impaired flexibility, Improper body mechanics, Postural dysfunction, Decreased strength, Decreased mobility  Visit Diagnosis: Muscle weakness (generalized)  Lack of coordination     Problem List Patient Active Problem List   Diagnosis Date Noted   S/P thyroidectomy 09/22/2020   Thyroid cancer ([place]) 09/22/2020   Class 1 obesity with serious comorbidity and body mass index (BMI) of 32.0 to 32.9 in adult 09/15/2020   Fibroid 08/31/2020   Other hyperlipidemia 08/26/2020   Insulin resistance 08/26/2020   Vitamin D deficiency 08/26/2020   Anxiety 06/18/2019   Mild episode of recurrent major depressive disorder ([place]) 05/13/2019    Junie Panning, PT 02/22/2021, 12:32 PM  [place] @ [place] [place], [place], [address] Phone: [phone]   Fax:  [phone]  Name: Patricia Tran MRN: [id number] Date of Birth: [birth year]

## 2021-02-22 NOTE — Telephone Encounter (Signed)
Please inform patient speech therapy requires her to have a formal swallow eval before being able to except referral to speech therapy.  This may sway whether or not she wants to start speech therapy or discussed with her gastroenterology team instead.  We can either place a referral to GI or consider ordering barium swallow for her  Please advise on patient's decision

## 2021-02-22 NOTE — Telephone Encounter (Signed)
Please see message below

## 2021-02-22 NOTE — Telephone Encounter (Signed)
Pt rather have MBSS instead of GI.

## 2021-02-23 ENCOUNTER — Other Ambulatory Visit (HOSPITAL_COMMUNITY): Payer: Self-pay

## 2021-02-23 ENCOUNTER — Ambulatory Visit (INDEPENDENT_AMBULATORY_CARE_PROVIDER_SITE_OTHER): Payer: 59 | Admitting: Family Medicine

## 2021-02-23 ENCOUNTER — Encounter (INDEPENDENT_AMBULATORY_CARE_PROVIDER_SITE_OTHER): Payer: Self-pay | Admitting: Family Medicine

## 2021-02-23 ENCOUNTER — Other Ambulatory Visit: Payer: Self-pay

## 2021-02-23 VITALS — BP 116/77 | HR 94 | Temp 97.8°F | Ht 63.0 in | Wt 167.0 lb

## 2021-02-23 DIAGNOSIS — Z6832 Body mass index (BMI) 32.0-32.9, adult: Secondary | ICD-10-CM

## 2021-02-23 DIAGNOSIS — R1310 Dysphagia, unspecified: Secondary | ICD-10-CM

## 2021-02-23 DIAGNOSIS — E669 Obesity, unspecified: Secondary | ICD-10-CM | POA: Diagnosis not present

## 2021-02-23 DIAGNOSIS — E559 Vitamin D deficiency, unspecified: Secondary | ICD-10-CM | POA: Diagnosis not present

## 2021-02-23 DIAGNOSIS — Z9189 Other specified personal risk factors, not elsewhere classified: Secondary | ICD-10-CM

## 2021-02-23 DIAGNOSIS — E8881 Metabolic syndrome: Secondary | ICD-10-CM

## 2021-02-23 MED ORDER — TIRZEPATIDE 2.5 MG/0.5ML ~~LOC~~ SOAJ
2.5000 mg | SUBCUTANEOUS | 0 refills | Status: DC
  Filled 2021-02-23: qty 2, 28d supply, fill #0

## 2021-02-23 MED ORDER — VITAMIN D (ERGOCALCIFEROL) 1.25 MG (50000 UNIT) PO CAPS
50000.0000 [IU] | ORAL_CAPSULE | ORAL | 0 refills | Status: DC
  Filled 2021-02-23: qty 4, 28d supply, fill #0

## 2021-02-23 NOTE — Telephone Encounter (Signed)
Order placed

## 2021-02-23 NOTE — Addendum Note (Signed)
Addended by: Howard Pouch A on: 02/23/2021 12:36 PM   Modules accepted: Orders

## 2021-02-23 NOTE — Progress Notes (Signed)
Chief Complaint:   OBESITY Patricia Tran is here to discuss her progress with her obesity treatment plan along with follow-up of her obesity related diagnoses. Patricia Tran is on the Category 2 Plan and states she is following her eating plan approximately 60% of the time. Patricia Tran states she is doing 0 minutes 0 times per week.  Today's visit was #: 12 Starting weight: 185 lbs Starting date: 08/11/2020 Today's weight: 167 lbs Today's date: 02/23/2021 Total lbs lost to date: 18 Total lbs lost since last in-office visit: 1  Interim History: Patricia Tran has been on vacation on a cruise and she did very well avoiding weight gain. She worked on increasing her protein and avoiding simple carbohydrates. She avoided sodas and she was very active.  Subjective:   1. Insulin resistance Patricia Tran is doing well on Mounjaro. She notes decreased gas but still has  decreased polyphagia.  2. Vitamin D deficiency Patricia Tran is stable on Vit D, and she denies nausea, vomiting, or muscle weakness.  3. At risk for diabetes mellitus Patricia Tran is at higher than average risk for developing diabetes due to obesity.   Assessment/Plan:   1. Insulin resistance Patricia Tran will continue with diet, exercise, and decreasing simple carbohydrates to help decrease the risk of diabetes. We will refill Mounjaro for 1 month. Patricia Tran agreed to follow-up with [place] as directed to closely monitor her progress.  - tirzepatide [facility]) 2.5 MG/0.5ML Pen; Inject 2.5 mg into the skin once a week.  Dispense: 2 mL; Refill: 0  2. Vitamin D deficiency Low Vitamin D level contributes to fatigue and are associated with obesity, breast, and colon cancer. Patricia Tran will continue prescription Vitamin D 50,000 IU every week and we will refill for 1 month. She will follow-up for routine testing of Vitamin D, at least 2-3 times per year to avoid over-replacement.  - Vitamin D, Ergocalciferol, (DRISDOL) 1.25 MG (50000 UNIT) CAPS capsule; Take 1 capsule (50,000 Units total) by mouth every 7  (seven) days.  Dispense: 4 capsule; Refill: 0  3. At risk for diabetes mellitus Patricia Tran was given approximately 15 minutes of diabetes education and counseling today. We discussed intensive lifestyle modifications today with an emphasis on weight loss as well as increasing exercise and decreasing simple carbohydrates in her diet. We also reviewed medication options with an emphasis on risk versus benefit of those discussed.   Repetitive spaced learning was employed today to elicit superior memory formation and behavioral change.  4. Obesity BMI today is 81 Patricia Tran is currently in the action stage of change. As such, her goal is to continue with weight loss efforts. She has agreed to the Category 2 Plan.   Behavioral modification strategies: increasing lean protein intake.  Patricia Tran has agreed to follow-up with our clinic in 4 weeks. She was informed of the importance of frequent follow-up visits to maximize her success with intensive lifestyle modifications for her multiple health conditions.   Objective:   Blood pressure 116/77, pulse 94, temperature 97.8 F (36.6 C), temperature source Oral, height 5\' 3"  (1.6 m), weight 167 lb (75.8 kg), SpO2 100 %. Body mass index is 29.58 kg/m.  General: Cooperative, alert, well developed, in no acute distress. HEENT: Conjunctivae and lids unremarkable. Cardiovascular: Regular rhythm.  Lungs: Normal work of breathing. Neurologic: No focal deficits.   Lab Results  Component Value Date   CREATININE 0.81 12/01/2020   BUN 12 12/01/2020   NA 140 12/01/2020   K 4.6 12/01/2020   CL 105 12/01/2020   CO2  22 12/01/2020   Lab Results  Component Value Date   ALT 24 12/01/2020   AST 20 12/01/2020   ALKPHOS 102 12/01/2020   BILITOT 0.3 12/01/2020   Lab Results  Component Value Date   HGBA1C 5.5 12/01/2020   HGBA1C 5.4 08/11/2020   HGBA1C 5.4 10/01/2018   Lab Results  Component Value Date   INSULIN 9.0 12/01/2020   INSULIN 10.2 08/11/2020   Lab  Results  Component Value Date   TSH 1.480 08/25/2020   Lab Results  Component Value Date   CHOL 194 12/01/2020   HDL 38 (L) 12/01/2020   LDLCALC 136 (H) 12/01/2020   TRIG 109 12/01/2020   Lab Results  Component Value Date   VD25OH 57.0 12/01/2020   VD25OH 33.7 08/11/2020   Lab Results  Component Value Date   WBC 6.6 08/11/2020   HGB 12.6 08/11/2020   HCT 39.9 08/11/2020   MCV 87 08/11/2020   PLT 331 10/01/2018   No results found for: IRON, TIBC, FERRITIN  Attestation Statements:   Reviewed by clinician on day of visit: allergies, medications, problem list, medical history, surgical history, family history, social history, and previous encounter notes.   I, Patricia Tran, am acting as transcriptionist for Dennard Nip, MD.  I have reviewed the above documentation for accuracy and completeness, and I agree with the above. -  Dennard Nip, MD

## 2021-02-24 ENCOUNTER — Other Ambulatory Visit (HOSPITAL_COMMUNITY): Payer: Self-pay

## 2021-02-25 ENCOUNTER — Encounter: Payer: Self-pay | Admitting: Family Medicine

## 2021-02-25 ENCOUNTER — Telehealth (INDEPENDENT_AMBULATORY_CARE_PROVIDER_SITE_OTHER): Payer: 59 | Admitting: Family Medicine

## 2021-02-25 VITALS — BP 110/77 | Wt 167.0 lb

## 2021-02-25 DIAGNOSIS — R0982 Postnasal drip: Secondary | ICD-10-CM | POA: Diagnosis not present

## 2021-02-25 DIAGNOSIS — J029 Acute pharyngitis, unspecified: Secondary | ICD-10-CM

## 2021-02-25 NOTE — Progress Notes (Signed)
Virtual Visit via Video Note  I connected with Patricia Tran  on 02/25/21 at 12:00 PM EST by a video enabled telemedicine application and verified that I am speaking with the correct person using two identifiers.  Location patient: home, [place] Location provider:work or home office Persons participating in the virtual visit: patient, provider  I discussed the limitations of evaluation and management by telemedicine and the availability of in person appointments. The patient expressed understanding and agreed to proceed.   HPI:  Acute telemedicine visit for : -Symptoms include:started off with a cold about a week ago with congestoin, etc, now throat is sore and red, neck glands sore, thick PND drip -Denies:fever, issues swallowing, CP, SOB, known strep exposure -Has tried:mobid which is helping -Pertinent past medical history:see below -Pertinent medication allergies: Allergies  Allergen Reactions   Codeine Itching  -COVID-19 vaccine status: Immunization History  Administered Date(s) Administered   Influenza,inj,Quad PF,6+ Mos 02/15/2021   PFIZER Comirnaty(Gray Top)Covid-19 Tri-Sucrose Vaccine 06/05/2020   PFIZER(Purple Top)SARS-COV-2 Vaccination 11/07/2019, 11/28/2019   Tdap 08/31/2020     ROS: See pertinent positives and negatives per HPI.  Past Medical History:  Diagnosis Date   Allergy    Anemia    Anxiety    Asthma    Constipation    Depression    Diverticulitis    Fibroids 2017   left sided uterine leiomyoma    Gallbladder disease    GERD (gastroesophageal reflux disease)    Goiter    Hyperlipidemia    Joint pain    Kidney stone 08/31/2016   incidental find on CT right renal calculus   Lactose intolerance    Lump in neck    Other fatigue    Ovarian cyst 08/31/2016   1.5 cm left ovarian cyst   SOB (shortness of breath) on exertion    Swallowing difficulty    Thyroid nodule 03/2013   2.6x1.9x1.8 cm on last US thyroid 02/2015- "stable".  lg mixed schogenic nodule left  thyroid. "Follicular lesion of unknown significance"    Past Surgical History:  Procedure Laterality Date   BIOPSY THYROID  06/2020   CHOLECYSTECTOMY  2011   POST OP ITCHING   TRANSAXILLERY ENDOSCOPIC LEFT THYROID LOBECTOMY WITH ROBOTIC ASSISTANCE Left 08/2020   scheduled end of May 2022.nodule- high concern for malignancy     Current Outpatient Medications:    buPROPion (WELLBUTRIN XL) 300 MG 24 hr tablet, Take 1 tablet (300 mg total) by mouth daily., Disp: 90 tablet, Rfl: 1   dicyclomine (BENTYL) 10 MG capsule, Take 2 capsules (20 mg total) by mouth 3 (three) times daily as needed for spasms., Disp: 60 capsule, Rfl: 1   levothyroxine (SYNTHROID) 50 MCG tablet, Take 1 tablet (50 mcg total) by mouth daily. Take on an empty stomach with a glass of water at least 30-60 minutes before breakfast., Disp: 30 tablet, Rfl: 11   LORazepam (ATIVAN) 0.5 MG tablet, Take 1 tablet (0.5 mg total) by mouth daily as needed for anxiety., Disp: 90 tablet, Rfl: 1   meloxicam (MOBIC) 15 MG tablet, Take 1 tablet (15 mg total) by mouth daily., Disp: 30 tablet, Rfl: 0   Multiple Vitamin (MULTIVITAMIN) tablet, Take 1 tablet by mouth daily., Disp: , Rfl:    polyethylene glycol (MIRALAX / GLYCOLAX) 17 g packet, Take 17 g by mouth daily., Disp: , Rfl:    tirzepatide (MOUNJARO) 2.5 MG/0.5ML Pen, Inject 2.5 mg into the skin once a week., Disp: 2 mL, Rfl: 0   Vitamin D, Ergocalciferol, (DRISDOL)  1.25 MG (50000 UNIT) CAPS capsule, Take 1 capsule (50,000 Units total) by mouth every 7 (seven) days., Disp: 4 capsule, Rfl: 0  EXAM:  VITALS per patient if applicable:  GENERAL: alert, oriented, appears well and in no acute distress  HEENT: atraumatic, conjunttiva clear, no obvious abnormalities on inspection of external nose and ears, got a pretty decent look at the oropharynx on video visit today - moist mucus membranes, some erythema of the posterior oropharynx with PND  NECK: normal movements of the head and  neck  LUNGS: on inspection no signs of respiratory distress, breathing rate appears normal, no obvious gross SOB, gasping or wheezing  CV: no obvious cyanosis  MS: moves all visible extremities without noticeable abnormality  PSYCH/NEURO: pleasant and cooperative, no obvious depression or anxiety, speech and thought processing grossly intact  ASSESSMENT AND PLAN:  Discussed the following assessment and plan:  PND (post-nasal drip)  Sore throat  -we discussed possible serious and likely etiologies, options for evaluation and workup, limitations of telemedicine visit vs in person visit, treatment, treatment risks and precautions. Pt is agreeable to treatment via telemedicine at this moment. Likely viral/postviral symptoms. Opted for salt water gargles, nsaids if needed (not to overlap with different products), oral hydration, short course nasal decongestant.  Advised to seek prompt in person care if worsening, new symptoms arise, or if is not improving with treatment. Discussed options for inperson care if PCP office not available. Did let this patient know that I only do telemedicine on Tuesdays and Thursdays for [place]. Advised to schedule follow up visit with PCP or [facility] if any further questions or concerns to avoid delays in care.   I discussed the assessment and treatment plan with the patient. The patient was provided an opportunity to ask questions and all were answered. The patient agreed with the plan and demonstrated an understanding of the instructions.     Lucretia Kern, DO

## 2021-02-25 NOTE — Patient Instructions (Signed)
-  short course Afrin nasal spray for 3 days for the post nasal drip  -drink plenty of fluids and avoid dairy products  -can take motrin or aleve per instructions as needed  -warm salt water gargles  I hope you are feeling better soon!  Seek in person care promptly if your symptoms worsen, new concerns arise or you are not improving with treatment.  It was nice to meet you today. I help [facility] out with telemedicine visits on Tuesdays and Thursdays and am available for visits on those days. If you have any concerns or questions following this visit please schedule a follow up visit with your Primary Care doctor or seek care at a local urgent care clinic to avoid delays in care.

## 2021-03-02 ENCOUNTER — Encounter: Payer: Self-pay | Admitting: Family Medicine

## 2021-03-02 ENCOUNTER — Other Ambulatory Visit: Payer: Self-pay

## 2021-03-02 ENCOUNTER — Ambulatory Visit (HOSPITAL_COMMUNITY)
Admission: RE | Admit: 2021-03-02 | Discharge: 2021-03-02 | Disposition: A | Discharge status: Home / Self Care | Payer: 59 | Source: Ambulatory Visit | Attending: Family Medicine | Admitting: Family Medicine

## 2021-03-02 DIAGNOSIS — E890 Postprocedural hypothyroidism: Secondary | ICD-10-CM | POA: Insufficient documentation

## 2021-03-02 DIAGNOSIS — R1310 Dysphagia, unspecified: Secondary | ICD-10-CM | POA: Insufficient documentation

## 2021-03-02 DIAGNOSIS — C73 Malignant neoplasm of thyroid gland: Secondary | ICD-10-CM

## 2021-03-02 NOTE — Telephone Encounter (Signed)
Please advise 

## 2021-03-02 NOTE — Progress Notes (Signed)
Modified Barium Swallow Progress Note  Patient Details  Name: Patricia Tran MRN: [id number] Date of Birth: 29-Dec-1970  Today's Date: 03/02/2021  Modified Barium Swallow completed.  Full report located under Chart Review in the Imaging Section.  Brief recommendations include the following:  Clinical Impression  Pt's oropharyngeal swallowing is WFL during testing, without clear source for her presenting symptoms. The barium tablet did appear to remain in her esophagus, requiring additional sips of thin liquids to clear (MD not present to confirm). Pt also reported xerostomia contributing to increased perceived effort in clearing oral cavity with a dry solid, but she reports that these are both different symptoms than what she has been experiencing at home. Could consider OP SLP referral to observe across more POs (or more specifically the POs that may give her difficulty). Would also consider GI consult given slowed transit of barium tablet and h/o GERD. Would continue with regular solids and thin liquids as tolerated.   Swallow Evaluation Recommendations   Recommended Consults: Consider GI evaluation   SLP Diet Recommendations: Regular solids;Thin liquid   Liquid Administration via: Cup;Straw   Medication Administration: Whole meds with liquid (could try with increased liquid wash vs trial whole in bites of puree)   Supervision: Patient able to self feed       Postural Changes: Seated upright at 90 degrees   Oral Care Recommendations: Oral care BID        Osie Bond., M.A. [facility] Pager [phone] Office [phone]  03/02/2021,2:32 PM

## 2021-03-08 ENCOUNTER — Telehealth: Payer: Self-pay | Admitting: Family Medicine

## 2021-03-08 ENCOUNTER — Encounter: Payer: 59 | Admitting: Physical Therapy

## 2021-03-08 DIAGNOSIS — R1319 Other dysphagia: Secondary | ICD-10-CM

## 2021-03-08 NOTE — Telephone Encounter (Signed)
Please call patient: I have placed the referral to Dr. Hilarie Fredrickson for her.  Would recommend she call their office later this week to expedite referral system. I have also placed the speech referral again.

## 2021-03-08 NOTE — Telephone Encounter (Signed)
LVM for pt to CB regarding referral.

## 2021-03-09 NOTE — Telephone Encounter (Signed)
Pt advised regarding referral updates.

## 2021-03-11 ENCOUNTER — Other Ambulatory Visit (INDEPENDENT_AMBULATORY_CARE_PROVIDER_SITE_OTHER): Payer: Self-pay | Admitting: Family Medicine

## 2021-03-11 ENCOUNTER — Other Ambulatory Visit: Payer: Self-pay

## 2021-03-11 ENCOUNTER — Encounter (INDEPENDENT_AMBULATORY_CARE_PROVIDER_SITE_OTHER): Payer: Self-pay | Admitting: Family Medicine

## 2021-03-11 ENCOUNTER — Other Ambulatory Visit (HOSPITAL_COMMUNITY): Payer: Self-pay

## 2021-03-11 ENCOUNTER — Ambulatory Visit (INDEPENDENT_AMBULATORY_CARE_PROVIDER_SITE_OTHER): Payer: 59 | Admitting: Family Medicine

## 2021-03-11 VITALS — BP 119/66 | HR 105 | Temp 97.7°F | Ht 63.0 in | Wt 165.0 lb

## 2021-03-11 DIAGNOSIS — E8881 Metabolic syndrome: Secondary | ICD-10-CM | POA: Diagnosis not present

## 2021-03-11 DIAGNOSIS — E669 Obesity, unspecified: Secondary | ICD-10-CM

## 2021-03-11 DIAGNOSIS — E890 Postprocedural hypothyroidism: Secondary | ICD-10-CM | POA: Diagnosis not present

## 2021-03-11 DIAGNOSIS — E559 Vitamin D deficiency, unspecified: Secondary | ICD-10-CM | POA: Diagnosis not present

## 2021-03-11 DIAGNOSIS — E038 Other specified hypothyroidism: Secondary | ICD-10-CM | POA: Diagnosis not present

## 2021-03-11 DIAGNOSIS — Z6832 Body mass index (BMI) 32.0-32.9, adult: Secondary | ICD-10-CM

## 2021-03-11 DIAGNOSIS — Z9189 Other specified personal risk factors, not elsewhere classified: Secondary | ICD-10-CM

## 2021-03-11 MED ORDER — VITAMIN D (ERGOCALCIFEROL) 1.25 MG (50000 UNIT) PO CAPS
50000.0000 [IU] | ORAL_CAPSULE | ORAL | 0 refills | Status: DC
  Filled 2021-03-11 – 2021-03-21 (×2): qty 4, 28d supply, fill #0

## 2021-03-11 MED ORDER — TIRZEPATIDE 2.5 MG/0.5ML ~~LOC~~ SOAJ
2.5000 mg | SUBCUTANEOUS | 0 refills | Status: DC
  Filled 2021-03-11 – 2021-03-21 (×2): qty 2, 28d supply, fill #0

## 2021-03-11 NOTE — Progress Notes (Signed)
Chief Complaint:   OBESITY Patricia Tran is here to discuss her progress with her obesity treatment plan along with follow-up of her obesity related diagnoses. Patricia Tran is on the Category 2 Plan and states she is following her eating plan approximately 30% of the time. Patricia Tran states she was hiking, swimming, and walking for 60 minutes 3 times for 1 week.  Today's visit was #: 13 Starting weight: 185 lbs Starting date: 08/11/2020 Today's weight: 165 lbs Today's date: 03/11/2021 Total lbs lost to date: 20 Total lbs lost since last in-office visit: 2  Interim History: Patricia Tran continues to do well with weight loss even while on a cruise. Her hunger is controlled and she is doing well with increasing her protein and decreasing simple carbohydrates.  Subjective:   1. Insulin resistance Patricia Tran is doing well with diet, and she notes decreased polyphagia. She denies nausea or vomiting.  2. Vitamin D deficiency Patricia Tran is stable on Vit D, and she denies nausea, vomiting, or muscle weakness.  3. Other specified hypothyroidism Patricia Tran is status post thyroidectomy. She requests labs to be done here so she can compare results drom [facility].  4. S/P thyroidectomy  5. At risk for diabetes mellitus Patricia Tran is at higher than average risk for developing diabetes due to obesity.   Assessment/Plan:   1. Insulin resistance Patricia Tran will continue to work on weight loss, exercise, and decreasing simple carbohydrates to help decrease the risk of diabetes. We will refill Mounjaro for 1 month. Patricia Tran agreed to follow-up with [place] as directed to closely monitor her progress.  - tirzepatide [facility]) 2.5 MG/0.5ML Pen; Inject 2.5 mg into the skin once a week.  Dispense: 2 mL; Refill: 0  2. Vitamin D deficiency Low Vitamin D level contributes to fatigue and are associated with obesity, breast, and colon cancer. We will refill prescription Vitamin D for 1 month. Patricia Tran will follow-up for routine testing of Vitamin D, at least 2-3 times per year  to avoid over-replacement.  - Vitamin D, Ergocalciferol, (DRISDOL) 1.25 MG (50000 UNIT) CAPS capsule; Take 1 capsule (50,000 Units total) by mouth every 7 (seven) days.  Dispense: 4 capsule; Refill: 0  3. Other specified hypothyroidism We will check labs today, and will follow up at [place] next visit. Orders and follow up as documented in patient record.  - TSH - T4, free - Thyroglobulin Level  4. S/P thyroidectomy  5. At risk for diabetes mellitus Patricia Tran was given approximately 15 minutes of diabetes education and counseling today. We discussed intensive lifestyle modifications today with an emphasis on weight loss as well as increasing exercise and decreasing simple carbohydrates in her diet. We also reviewed medication options with an emphasis on risk versus benefit of those discussed.   Repetitive spaced learning was employed today to elicit superior memory formation and behavioral change.  6. Obesity BMI today is 29.3 Patricia Tran is currently in the action stage of change. As such, her goal is to continue with weight loss efforts. She has agreed to the Category 2 Plan.   Exercise goals: As is.  Behavioral modification strategies: increasing lean protein intake and meal planning and cooking strategies.  Patricia Tran has agreed to follow-up with our clinic in 4 weeks. She was informed of the importance of frequent follow-up visits to maximize her success with intensive lifestyle modifications for her multiple health conditions.   Patricia Tran was informed we would discuss her lab results at her next visit unless there is a critical issue that needs to be addressed  sooner. Patricia Tran agreed to keep her next visit at the agreed upon time to discuss these results.  Objective:   Blood pressure 119/66, pulse (!) 105, temperature 97.7 F (36.5 C), height 5\' 3"  (1.6 m), weight 165 lb (74.8 kg), last menstrual period 02/20/2021, SpO2 100 %. Body mass index is 29.23 kg/m.  General: Cooperative, alert, well developed,  in no acute distress. HEENT: Conjunctivae and lids unremarkable. Cardiovascular: Regular rhythm.  Lungs: Normal work of breathing. Neurologic: No focal deficits.   Lab Results  Component Value Date   CREATININE 0.81 12/01/2020   BUN 12 12/01/2020   NA 140 12/01/2020   K 4.6 12/01/2020   CL 105 12/01/2020   CO2 22 12/01/2020   Lab Results  Component Value Date   ALT 24 12/01/2020   AST 20 12/01/2020   ALKPHOS 102 12/01/2020   BILITOT 0.3 12/01/2020   Lab Results  Component Value Date   HGBA1C 5.5 12/01/2020   HGBA1C 5.4 08/11/2020   HGBA1C 5.4 10/01/2018   Lab Results  Component Value Date   INSULIN 9.0 12/01/2020   INSULIN 10.2 08/11/2020   Lab Results  Component Value Date   TSH 1.480 08/25/2020   Lab Results  Component Value Date   CHOL 194 12/01/2020   HDL 38 (L) 12/01/2020   LDLCALC 136 (H) 12/01/2020   TRIG 109 12/01/2020   Lab Results  Component Value Date   VD25OH 57.0 12/01/2020   VD25OH 33.7 08/11/2020   Lab Results  Component Value Date   WBC 6.6 08/11/2020   HGB 12.6 08/11/2020   HCT 39.9 08/11/2020   MCV 87 08/11/2020   PLT 331 10/01/2018   No results found for: IRON, TIBC, FERRITIN  Attestation Statements:   Reviewed by clinician on day of visit: allergies, medications, problem list, medical history, surgical history, family history, social history, and previous encounter notes.   I, Trixie Dredge, am acting as transcriptionist for Dennard Nip, MD.  I have reviewed the above documentation for accuracy and completeness, and I agree with the above. -  Dennard Nip, MD

## 2021-03-12 ENCOUNTER — Other Ambulatory Visit (HOSPITAL_COMMUNITY): Payer: Self-pay

## 2021-03-12 LAB — T4, FREE: Free T4: 1.14 ng/dL (ref 0.82–1.77)

## 2021-03-12 LAB — TSH: TSH: 1.26 u[IU]/mL (ref 0.450–4.500)

## 2021-03-15 ENCOUNTER — Ambulatory Visit: Payer: 59 | Attending: Family Medicine | Admitting: Physical Therapy

## 2021-03-15 ENCOUNTER — Ambulatory Visit (INDEPENDENT_AMBULATORY_CARE_PROVIDER_SITE_OTHER): Payer: 59 | Admitting: Nurse Practitioner

## 2021-03-15 ENCOUNTER — Other Ambulatory Visit: Payer: Self-pay

## 2021-03-15 ENCOUNTER — Ambulatory Visit (INDEPENDENT_AMBULATORY_CARE_PROVIDER_SITE_OTHER): Payer: 59 | Admitting: Family Medicine

## 2021-03-15 ENCOUNTER — Encounter: Payer: Self-pay | Admitting: Nurse Practitioner

## 2021-03-15 VITALS — BP 114/64 | HR 104 | Ht 63.0 in | Wt 172.0 lb

## 2021-03-15 DIAGNOSIS — M6281 Muscle weakness (generalized): Secondary | ICD-10-CM

## 2021-03-15 DIAGNOSIS — R279 Unspecified lack of coordination: Secondary | ICD-10-CM

## 2021-03-15 DIAGNOSIS — R1313 Dysphagia, pharyngeal phase: Secondary | ICD-10-CM | POA: Diagnosis not present

## 2021-03-15 DIAGNOSIS — R1310 Dysphagia, unspecified: Secondary | ICD-10-CM | POA: Diagnosis not present

## 2021-03-15 DIAGNOSIS — R293 Abnormal posture: Secondary | ICD-10-CM | POA: Diagnosis not present

## 2021-03-15 NOTE — Patient Instructions (Signed)
You have been scheduled for a Barium Esophogram at [facility] (1st floor of the hospital) on Tuesday 04/06/21 at 9:30 am. Please arrive 15 minutes prior to your appointment for registration. Make certain not to have anything to eat or drink 3 hours prior to your test. If you need to reschedule for any reason, please contact radiology at [phone] to do so. __________________________________________________________________ A barium swallow is an examination that concentrates on views of the esophagus. This tends to be a double contrast exam (barium and two liquids which, when combined, create a gas to distend the wall of the oesophagus) or single contrast (non-ionic iodine based). The study is usually tailored to your symptoms so a good history is essential. Attention is paid during the study to the form, structure and configuration of the esophagus, looking for functional disorders (such as aspiration, dysphagia, achalasia, motility and reflux) EXAMINATION You may be asked to change into a gown, depending on the type of swallow being performed. A radiologist and radiographer will perform the procedure. The radiologist will advise you of the type of contrast selected for your procedure and direct you during the exam. You will be asked to stand, sit or lie in several different positions and to hold a small amount of fluid in your mouth before being asked to swallow while the imaging is performed .In some instances you may be asked to swallow barium coated marshmallows to assess the motility of a solid food bolus. The exam can be recorded as a digital or video fluoroscopy procedure. POST PROCEDURE It will take 1-2 days for the barium to pass through your system. To facilitate this, it is important, unless otherwise directed, to increase your fluids for the next 24-48hrs and to resume your normal diet.  This test typically takes about 30 minutes to  perform. __________________________________________________________________________  If you are age 58 or older, your body mass index should be between 23-30. Your Body mass index is 30.47 kg/m. If this is out of the aforementioned range listed, please consider follow up with your Primary Care Provider.  If you are age 79 or younger, your body mass index should be between 19-25. Your Body mass index is 30.47 kg/m. If this is out of the aformentioned range listed, please consider follow up with your Primary Care Provider.   ________________________________________________________  The  GI providers would like to encourage you to use [facility] to communicate with providers for non-urgent requests or questions.  Due to long hold times on the telephone, sending your provider a message by [facility] may be a faster and more efficient way to get a response.  Please allow 48 business hours for a response.  Please remember that this is for non-urgent requests.  _______________________________________________________

## 2021-03-15 NOTE — Therapy (Signed)
[facility] [place], [place], [address] Phone: [phone]   Fax:  [phone]  Physical Therapy Treatment  Patient Details  Name: Patricia Tran MRN: [id number] Date of Birth: Jan 25, 1971 Referring Provider (PT): Ma Hillock, DO   Encounter Date: 03/15/2021   PT End of Session - 03/15/21 1146     Visit Number 3    Date for PT Re-Evaluation 05/20/21    Authorization Type [employer] choice    Authorization - Number of Visits 25    PT Start Time 1109   pt arrival time   PT Stop Time 1145    PT Time Calculation (min) 36 min    Activity Tolerance Patient tolerated treatment well    Behavior During Therapy [facility] for tasks assessed/performed             Past Medical History:  Diagnosis Date   Allergy    Anemia    Anxiety    Asthma    Constipation    Depression    Diverticulitis    Fibroids 2017   left sided uterine leiomyoma    Gallbladder disease    GERD (gastroesophageal reflux disease)    Goiter    Hyperlipidemia    Joint pain    Kidney stone 08/31/2016   incidental find on CT right renal calculus   Lactose intolerance    Lump in neck    Other fatigue    Ovarian cyst 08/31/2016   1.5 cm left ovarian cyst   SOB (shortness of breath) on exertion    Swallowing difficulty    Thyroid nodule 03/2013   2.6x1.9x1.8 cm on last US thyroid 02/2015- "stable".  lg mixed schogenic nodule left thyroid. "Follicular lesion of unknown significance"    Past Surgical History:  Procedure Laterality Date   BIOPSY THYROID  06/2020   CHOLECYSTECTOMY  2011   POST OP ITCHING   TRANSAXILLERY ENDOSCOPIC LEFT THYROID LOBECTOMY WITH ROBOTIC ASSISTANCE Left 08/2020   scheduled end of May 2022.nodule- high concern for malignancy    There were no vitals filed for this visit.   Subjective Assessment - 03/15/21 1111     Subjective Pt reports leakage has improved greatly since starting PT, has been implementing  knack method and continues to use vaginal weights at home. Pt has been having less leaks overall and when she does has small leaks she notices less in amount as well.    Pertinent History thyroid cancer, no radiation or chemo    How long can you sit comfortably? no limits    How long can you stand comfortably? no limits    How long can you walk comfortably? no limits    Patient Stated Goals to leak less    Currently in Pain? No/denies                No emotional/communication barriers or cognitive limitation. Patient is motivated to learn. Patient understands and agrees with treatment goals and plan. PT explains patient will be examined in standing, sitting, and lying down to see how their muscles and joints work. When they are ready, they will be asked to remove their underwear so PT can examine their perineum. The patient is also given the option of providing their own chaperone as one is not provided in our facility. The patient also has the right and is explained the right to defer or refuse any part of the evaluation or treatment including the internal exam. With the  patient's consent, PT will use one gloved finger to gently assess the muscles of the pelvic floor, seeing how well it contracts and relaxes and if there is muscle symmetry. After, the patient will get dressed and PT and patient will discuss exam findings and plan of care. PT and patient discuss plan of care, schedule, attendance policy and HEP activities.          Pelvic Floor Special Questions - 03/15/21 0001     Pelvic Floor Internal Exam patient identified and patient confirms consent for PT to perform internal soft tissue work and muscle strength and integrity assessment    Exam Type Vaginal    Sensation WFL    Palpation no TTP    Strength fair squeeze, definite lift   with quick release   Strength # of reps 6   pt showed ability to complete at least one rep with 8s at a 3/5 strength but on average 6 was  measurement   Strength # of seconds 6   ability to hold for 10s with some contraction though lesser strength than 3/5.   Tone decreased               [facility] Adult PT Treatment/Exercise - 03/15/21 0001       Neuro Re-ed    Neuro Re-ed Details  Pt directed in NMRE with internal pelvic floor treatment with 3x10 contractions; 6R48 quick flicks; and 3x5 contract and holds for 5s and ability to hold x4 for 10s though with mild decrease in strength at 6s Quick release technique needed to improve isolation of pelvic floor and improved strength/activation of muscles                     PT Education - 03/15/21 1146     Education Details Pt educated on continued use of vaginal weights at home, HEP, and bladder diary given today.    Person(s) Educated Patient    Methods Explanation;Demonstration;Tactile cues;Verbal cues;Handout    Comprehension Returned demonstration;Verbalized understanding              PT Short Term Goals - 02/17/21 1120       PT SHORT TERM GOAL #1   Title Pt to be I with HEP    Time 6    Period Weeks    Status New    Target Date 03/31/21      PT SHORT TERM GOAL #2   Title pt to demonstrate at least 3/5 pelvic floor strength to decrease urinary incontinence symptoms    Baseline 2/5    Time 6    Period Weeks    Status New    Target Date 03/31/21      PT SHORT TERM GOAL #3   Title pt to report no more than 2 urinary leaks per week to decrease symptoms and improve QOL.    Baseline multiple times per week    Time 6    Period Weeks    Status New    Target Date 03/31/21               PT Long Term Goals - 02/17/21 1121       PT LONG TERM GOAL #1   Title Pt to be I with advanced HEP    Time 3    Period Months    Status New    Target Date 05/20/21      PT LONG TERM GOAL #2   Title pt to demonstrate at  least 4/5 pelvic floor strength to decrease urinary incontinence symptoms    Baseline 2/5    Time 3    Period Months    Status New     Target Date 05/20/21      PT LONG TERM GOAL #3   Title pt to report no more than 1 urinary leak per month to decrease symptoms and improve QOL.    Time 3    Period Months    Status New    Target Date 05/20/21      PT LONG TERM GOAL #4   Title pt to demonstrate improved coordination with breathing mechanics and pelvic floor contract/relax with activities at least 75% of the time to decrease strain on pelvic floor.    Time 3    Period Months    Target Date 05/20/21                   Plan - 03/15/21 1147     Clinical Impression Statement Pt presents to clinic reporting greatly improved symptoms since last session, now able to complete knack method more consistently and has been completing HEP and using vaginal weights at home. Pt reports she has noticed improvement with less overall leakage episodes, less amounts of leakage, and now ability to hold urine with urge for tasks as needed and with stressors of sneezing intermittently without consistent leaks. Pt does reports she has had really small leaks with some tasks or stressors but much less and not every time. Pt session focused on internal pelvic treatment with improving strength, endurance, and coordination with NMRE training. pt continued to benefit from verbal cues and quick release to improve contraction and holds. Pt also given bladder diary this session to improve understanding of frequency as needed at home and leakage instances.  Pt would benefit from continued PT to further address urinary leakage symptoms.    Personal Factors and Comorbidities Time since onset of injury/illness/exacerbation;Comorbidity 1    Comorbidities one vaginal birth    Examination-Activity Limitations Continence    Examination-Participation Restrictions Community Activity;Shop    Stability/Clinical Decision Making Stable/Uncomplicated    Rehab Potential Good    PT Frequency 1x / week    PT Duration Other (comment)   10   PT  Treatment/Interventions ADLs/Self Care Home Management;Functional mobility training;Therapeutic activities;Therapeutic exercise;Neuromuscular re-education;Manual techniques;Patient/family education;Taping;Scar mobilization;Passive range of motion;Energy conservation;Aquatic Therapy    PT Next Visit Plan breathing mechanics with actvity    Consulted and Agree with Plan of Care Patient             Patient will benefit from skilled therapeutic intervention in order to improve the following deficits and impairments:  Decreased coordination, Decreased endurance, Impaired tone, Impaired flexibility, Improper body mechanics, Postural dysfunction, Decreased strength, Decreased mobility  Visit Diagnosis: Muscle weakness (generalized)  Lack of coordination  Abnormal posture     Problem List Patient Active Problem List   Diagnosis Date Noted   S/P thyroidectomy 09/22/2020   Thyroid cancer ([place]) 09/22/2020   Class 1 obesity with serious comorbidity and body mass index (BMI) of 32.0 to 32.9 in adult 09/15/2020   Fibroid 08/31/2020   Other hyperlipidemia 08/26/2020   Insulin resistance 08/26/2020   Vitamin D deficiency 08/26/2020   Anxiety 06/18/2019   Mild episode of recurrent major depressive disorder ([place]) 05/13/2019    Stacy Gardner, PT, DPT 03/15/2210:57 AM   [place] [facility] [place], [place], [address] Phone: [phone]  Fax:  [phone]  Name: [place] Ke MRN: [id number] Date of Birth: 09-13-1970

## 2021-03-15 NOTE — Progress Notes (Signed)
ASSESSMENT AND PLAN    # 50 yo female with abnormal sensation ( really more of an "awareness") of left throat  / neck only with swallowing. Describes this as being aware of the left throat muscles working to help her swallow.  Symptoms started following left thyroid lobectomy with isthmusectomy.( transaxillary) in June 2022. No associated pain. She has chronic pill dysphagia predating surgery but that is unchanged.  No problems with food or drink, however, there is mention on recent MBSS that the barium tablet did appear to remain in her esophagus requiring additional sips to clear.  It is not clear if her left throat sensation has anything to do with the slow passage of the barium tablet on MBSS.  --For further evaluation will arrange for EGD. The risks and benefits of EGD with possible biopsies were discussed with the patient who agrees to proceed.  --I will also arrange for a barium swallow to be done following the EGD ( assuming there are no findings on EGD to explain her symptoms  and / or slow passage of barium tablet suggested on MBSS    HISTORY OF PRESENT ILLNESS     Chief Complaint : abnormal feeling in left throat  Patricia Tran is a 50 y.o. female with a past medical history significant for papillary thyroid cancer with "follicular" features, status post left lobectomy. See PMH below for any additional history.   Patient is known to Dr. Hilarie Fredrickson from screening colonoscopy in August of this year.  In June 2022 she underwent transaxillary endoscopic left thyroid lobectomy with isthmusectomy. She had papillary thyroid carcinoma with follicular features.   [place] has chronic pill dysphagia but no problems swallowing food nor liquids. She comes in today because she has developed an awareness of her left throat since having thyroid cancer surgery.  She has no odynophagia.  Again she has no problems swallowing food or liquid.  Patient is aware of her left throat every time she  swallows, even when swallowing saliva.   [place] recently had a modified barium swallow study.  Oropharyngeal swallowing was normal.  No clear-cut source found for her presenting symptoms.  Per MBSS provider performing the exam, the barium tablet did appear to remain in her esophagus requiring additional sips of thin liquids to clear.    Past Medical History:  Diagnosis Date   Allergy    Anemia    Anxiety    Asthma    Constipation    Depression    Diverticulitis    Fibroids 2017   left sided uterine leiomyoma    Gallbladder disease    GERD (gastroesophageal reflux disease)    Goiter    Hyperlipidemia    Kidney stone 08/31/2016   incidental find on CT right renal calculus   Lactose intolerance    Lump in neck    Ovarian cyst 08/31/2016   1.5 cm left ovarian cyst   SOB (shortness of breath) on exertion    Swallowing difficulty    Thyroid nodule 03/2013   2.6x1.9x1.8 cm on last US thyroid 02/2015- "stable".  lg mixed schogenic nodule left thyroid. "Follicular lesion of unknown significance"     Past Surgical History:  Procedure Laterality Date   BIOPSY THYROID  06/2020   CHOLECYSTECTOMY  2011   POST OP ITCHING   TRANSAXILLERY ENDOSCOPIC LEFT THYROID LOBECTOMY WITH ROBOTIC ASSISTANCE Left 08/2020   scheduled end of May 2022.nodule- high concern for malignancy   Family History  Problem Relation Age of Onset  Early death Mother    Kidney disease Mother    Alcohol abuse Father    Cancer Father        oral HPV   Alcoholism Father    Obesity Father    Colitis Father    Colon polyps Father    Diabetes Maternal Grandmother    Alcohol abuse Maternal Grandfather    Heart disease Maternal Grandfather    Heart attack Paternal Grandmother    Heart attack Paternal Grandfather    Colon cancer Neg Hx    Esophageal cancer Neg Hx    Stomach cancer Neg Hx    Social History   Tobacco Use   Smoking status: Never   Smokeless tobacco: Never  Vaping Use   Vaping Use: Never used   Substance Use Topics   Alcohol use: Not Currently    Comment: RARE   Drug use: Never   Current Outpatient Medications  Medication Sig Dispense Refill   buPROPion (WELLBUTRIN XL) 300 MG 24 hr tablet Take 1 tablet (300 mg total) by mouth daily. 90 tablet 1   dicyclomine (BENTYL) 10 MG capsule Take 2 capsules (20 mg total) by mouth 3 (three) times daily as needed for spasms. 60 capsule 1   levothyroxine (SYNTHROID) 50 MCG tablet Take 1 tablet (50 mcg total) by mouth daily. Take on an empty stomach with a glass of water at least 30-60 minutes before breakfast. 30 tablet 11   LORazepam (ATIVAN) 0.5 MG tablet Take 1 tablet (0.5 mg total) by mouth daily as needed for anxiety. 90 tablet 1   meloxicam (MOBIC) 15 MG tablet Take 1 tablet (15 mg total) by mouth daily. 30 tablet 0   Multiple Vitamin (MULTIVITAMIN) tablet Take 1 tablet by mouth daily.     polyethylene glycol (MIRALAX / GLYCOLAX) 17 g packet Take 17 g by mouth daily.     tirzepatide [name]) 2.5 MG/0.5ML Pen Inject 2.5 mg into the skin once a week. 2 mL 0   Vitamin D, Ergocalciferol, (DRISDOL) 1.25 MG (50000 UNIT) CAPS capsule Take 1 capsule (50,000 Units total) by mouth every 7 (seven) days. 4 capsule 0   No current facility-administered medications for this visit.     Review of Systems: Not weight loss. All other systems reviewed and negative except where noted in HPI.   PHYSICAL EXAM :    Wt Readings from Last 3 Encounters:  03/15/21 172 lb (78 kg)  03/11/21 165 lb (74.8 kg)  02/25/21 167 lb (75.8 kg)    BP 114/64   Pulse (!) 104   Ht 5\' 3"  (1.6 m)   Wt 172 lb (78 kg)   LMP 02/20/2021   SpO2 98%   BMI 30.47 kg/m  Constitutional:  Generally well appearing female in no acute distress. Psychiatric: Pleasant. Normal mood and affect. Behavior is normal. EENT: Pupils normal.  Conjunctivae are normal. No scleral icterus. Neck supple. No masses felt   Cardiovascular: Normal rate, regular rhythm. No edema Pulmonary/chest:  Effort normal and breath sounds normal. No wheezing, rales or rhonchi. Abdominal: Soft, nondistended, nontender. Bowel sounds active throughout. There are no masses palpable. No hepatomegaly. Neurological: Alert and oriented to person place and time. Skin: Skin is warm and dry. No rashes noted.  Tye Savoy, NP  03/15/2021, 3:20 PM

## 2021-03-16 ENCOUNTER — Ambulatory Visit (INDEPENDENT_AMBULATORY_CARE_PROVIDER_SITE_OTHER): Payer: 59 | Admitting: Family Medicine

## 2021-03-18 DIAGNOSIS — E890 Postprocedural hypothyroidism: Secondary | ICD-10-CM | POA: Diagnosis not present

## 2021-03-18 DIAGNOSIS — C73 Malignant neoplasm of thyroid gland: Secondary | ICD-10-CM | POA: Diagnosis not present

## 2021-03-21 ENCOUNTER — Other Ambulatory Visit (HOSPITAL_COMMUNITY): Payer: Self-pay

## 2021-03-22 ENCOUNTER — Ambulatory Visit: Payer: 59 | Admitting: Physical Therapy

## 2021-03-22 ENCOUNTER — Other Ambulatory Visit: Payer: Self-pay

## 2021-03-22 ENCOUNTER — Ambulatory Visit: Payer: 59

## 2021-03-22 ENCOUNTER — Other Ambulatory Visit (HOSPITAL_COMMUNITY): Payer: Self-pay

## 2021-03-22 DIAGNOSIS — R293 Abnormal posture: Secondary | ICD-10-CM | POA: Diagnosis not present

## 2021-03-22 DIAGNOSIS — R1313 Dysphagia, pharyngeal phase: Secondary | ICD-10-CM

## 2021-03-22 DIAGNOSIS — R279 Unspecified lack of coordination: Secondary | ICD-10-CM | POA: Diagnosis not present

## 2021-03-22 DIAGNOSIS — M6281 Muscle weakness (generalized): Secondary | ICD-10-CM | POA: Diagnosis not present

## 2021-03-22 LAB — THYROGLOBULIN LEVEL: Thyroglobulin (TG-RIA): 11 ng/mL

## 2021-03-22 MED ORDER — MELOXICAM 15 MG PO TABS
15.0000 mg | ORAL_TABLET | Freq: Every day | ORAL | 0 refills | Status: DC
  Filled 2021-03-22: qty 30, 30d supply, fill #0

## 2021-03-22 NOTE — Patient Instructions (Signed)
  Do effortful swallow and swallow and squeeze 10-15x, 2-3x/day for 4-6 weeks

## 2021-03-22 NOTE — Therapy (Signed)
[facility] [address], [place], [place], [address] Phone: [phone]   Fax:  [phone]  Speech Language Pathology Evaluation  Patient Details  Name: Patricia Tran MRN: [id number] Date of Birth: [birth year] Referring Provider (SLP): Zenovia Jarred, MD   Encounter Date: 03/22/2021   End of Session - 03/22/21 0933     Visit Number 1    Number of Visits 3    Date for SLP Re-Evaluation 05/14/21    SLP Start Time 0847    SLP Stop Time  3220    SLP Time Calculation (min) 34 min    Activity Tolerance Patient tolerated treatment well             Past Medical History:  Diagnosis Date   Allergy    Anemia    Anxiety    Asthma    Constipation    Depression    Diverticulitis    Fibroids 2017   left sided uterine leiomyoma    Gallbladder disease    GERD (gastroesophageal reflux disease)    Goiter    Hyperlipidemia    Joint pain    Kidney stone 08/31/2016   incidental find on CT right renal calculus   Lactose intolerance    Lump in neck    Other fatigue    Ovarian cyst 08/31/2016   1.5 cm left ovarian cyst   SOB (shortness of breath) on exertion    Swallowing difficulty    Thyroid nodule 03/2013   2.6x1.9x1.8 cm on last US thyroid 02/2015- "stable".  lg mixed schogenic nodule left thyroid. "Follicular lesion of unknown significance"    Past Surgical History:  Procedure Laterality Date   BIOPSY THYROID  06/2020   CHOLECYSTECTOMY  2011   POST OP ITCHING   TRANSAXILLERY ENDOSCOPIC LEFT THYROID LOBECTOMY WITH ROBOTIC ASSISTANCE Left 08/2020   scheduled end of May 2022.nodule- high concern for malignancy    There were no vitals filed for this visit.       SLP Evaluation [facility] - 03/22/21 0845       SLP Visit Information   SLP Received On 03/22/21    Referring Provider (SLP) Zenovia Jarred, MD    Onset Date May 2022    Medical Diagnosis S/P lt thyroidectomy      Subjective   Patient/Family Stated Goal  "Find out if this what I have to live with - if so I can move on."      General Information   HPI Pt is a 50 yo female presenting for OP MBS with reports of increased effort needed to swallow s/p L thyroidectomy in May 2022 for thyroid cancer. She does report a reduction in other symptoms of dysphagia that she had been experiencing prior to her surgery, including coughing and boluses feeling stuck. PMH also includes: GERD, anxiety, SOB on exertion. MBSS 03-02-21 resulted in [facility] swallowing without a clear reason for her reported symptoms. Pt appeared to have most difficulty with barium tablet remaining in esophagus (MD not present to confirm). GI consult was recommended and was ordered. Rec: cont with regular/thin as tolerated, consider OPST.      Prior Functional Status   Cognitive/Linguistic Baseline Within functional limits      Cognition   Overall Cognitive Status Within Functional Limits for tasks assessed      Auditory Comprehension   Overall Auditory Comprehension Appears within functional limits for tasks assessed      Verbal Expression   Overall Verbal Expression Appears  within functional limits for tasks assessed      Oral Motor/Sensory Function   Overall Oral Motor/Sensory Function Appears within functional limits for tasks assessed      Motor Speech   Overall Motor Speech Appears within functional limits for tasks assessed                             SLP Education - 03/22/21 0930     Education Details possibilities for rationale of her swallowing difficulty    Person(s) Educated Patient    Methods Explanation    Comprehension Verbalized understanding                SLP Long Term Goals - 03/22/21 0944       SLP LONG TERM GOAL #1   Title pt will report more successful swallowing with both pharyngeal channels than prior to ST eval    Time 2    Period --   sessions   Status New    Target Date 05/14/21      SLP LONG TERM GOAL #2   Title pt  will perform effortful swallow and Mendelsohn maneuver with modified independence    Time 2    Period --   sessions   Status New    Target Date 05/14/21              Plan - 03/22/21 0933     Clinical Impression Statement Patricia Tran presents today with reported sx of difficulty swallowing saliva and secretions but states she does not note any difficulty during meals, even with dryer or "heavier" items. Occasionally, she tells SLP a pill will not transfer through the pharynx and she will need to expectorate it and administer another one. She had extrememly thick greek yogurt just prior to arriving and did not have any problems noted with that. Pt had cereal bar and Gatorade with SLP, without challenge until SLP had pt turn head to rt, at which time she winced slightly and told SLP she had discomfort and that "most of it went down this (left channel) way." This feeling occurred less and less as pt turned her head to the rt during the next 7 boluses during this PO trial. SLP postulated that there may have been some pharyngeal muscle atrophy during the time pt had her growth (prior to lt thyroidectomy), which has persisted to present - thus most of her POs were traveling through the rt pharyngeal channel. Pt expressed thanks to SLP and SLP provided and taught pt 2 pharyngeal exercises pt will complete over the next 4-6 weeks. She scheduled a follow up appointment for 4 weeks but will call to cancel if she feels it is not necessary.    Speech Therapy Frequency Monthly    Duration --   2 sessions   Treatment/Interventions Pharyngeal strengthening exercises;Diet toleration management by SLP;Trials of upgraded texture/liquids;Patient/family education;SLP instruction and feedback    Potential to Achieve Goals Good    SLP Home Exercise Plan provided    Consulted and Agree with Plan of Care Patient             Patient will benefit from skilled therapeutic intervention in order to improve the following  deficits and impairments:   Dysphagia, pharyngeal phase    Problem List Patient Active Problem List   Diagnosis Date Noted   S/P thyroidectomy 09/22/2020   Thyroid cancer ([place]) 09/22/2020   Class 1 obesity with serious comorbidity and  body mass index (BMI) of 32.0 to 32.9 in adult 09/15/2020   Fibroid 08/31/2020   Other hyperlipidemia 08/26/2020   Insulin resistance 08/26/2020   Vitamin D deficiency 08/26/2020   Anxiety 06/18/2019   Mild episode of recurrent major depressive disorder ([place]) 05/13/2019    [place], [place] 03/22/2021, 3:59 PM  [facility] [address] [place], [place], [address] Phone: [phone]   Fax:  [phone]  Name: Patricia Tran MRN: [id number] Date of Birth: 1970-06-18

## 2021-03-22 NOTE — Therapy (Signed)
[facility] @ [place], [place], [address] Phone: [phone]   Fax:  [phone]  Physical Therapy Treatment  Patient Details  Name: Patricia Tran MRN: [id number] Date of Birth: [birth year] Referring Provider (PT): Ma Hillock, DO   Encounter Date: 03/22/2021   PT End of Session - 03/22/21 0842     Visit Number 4    Date for PT Re-Evaluation 05/20/21    Authorization Type [employer] choice    Authorization - Number of Visits 25    PT Start Time 0805   pt arrival   PT Stop Time 0839   pt needed to leave early for next appointment   PT Time Calculation (min) 34 min    Activity Tolerance Patient tolerated treatment well    Behavior During Therapy [facility] for tasks assessed/performed             Past Medical History:  Diagnosis Date   Allergy    Anemia    Anxiety    Asthma    Constipation    Depression    Diverticulitis    Fibroids 2017   left sided uterine leiomyoma    Gallbladder disease    GERD (gastroesophageal reflux disease)    Goiter    Hyperlipidemia    Joint pain    Kidney stone 08/31/2016   incidental find on CT right renal calculus   Lactose intolerance    Lump in neck    Other fatigue    Ovarian cyst 08/31/2016   1.5 cm left ovarian cyst   SOB (shortness of breath) on exertion    Swallowing difficulty    Thyroid nodule 03/2013   2.6x1.9x1.8 cm on last US thyroid 02/2015- "stable".  lg mixed schogenic nodule left thyroid. "Follicular lesion of unknown significance"    Past Surgical History:  Procedure Laterality Date   BIOPSY THYROID  06/2020   CHOLECYSTECTOMY  2011   POST OP ITCHING   TRANSAXILLERY ENDOSCOPIC LEFT THYROID LOBECTOMY WITH ROBOTIC ASSISTANCE Left 08/2020   scheduled end of May 2022.nodule- high concern for malignancy    There were no vitals filed for this visit.   Subjective Assessment - 03/22/21 0807     Subjective Pt reports leakage has continued to  improve but has stalled a bit with taking care of husband post his surgery.    Pertinent History thyroid cancer, no radiation or chemo    How long can you sit comfortably? no limits    How long can you stand comfortably? no limits    How long can you walk comfortably? no limits    Patient Stated Goals to leak less    Currently in Pain? No/denies                               [facility] Adult PT Treatment/Exercise - 03/22/21 0001       Exercises   Exercises Lumbar;Knee/Hip      Lumbar Exercises: Standing   Other Standing Lumbar Exercises mario punches 4#DB 2x10; diagonal oblique green band resistance with exhale and kegel x10    Other Standing Lumbar Exercises x10 palloffs green band; rotation palloffs green band x10; standing wall pushups x10      Lumbar Exercises: Seated   Sit to Stand 20 reps    Sit to Stand Limitations 10#kettlebell with exhale and kegel      Lumbar Exercises: Supine   Clam 20 reps  Clam Limitations red loop with exhale and kegal    Bridge 20 reps    Bridge Limitations 2x10 with exhale and kegal    Basic Lumbar Stabilization Limitations opp arm/knee press 2x10      Lumbar Exercises: Prone   Opposite Arm/Leg Raise Right arm/Left leg;Left arm/Right leg;20 reps    Opposite Arm/Leg Raise Limitations 2x10      Lumbar Exercises: Quadruped   Madcat/Old Horse 10 reps                     PT Education - 03/22/21 0841     Education Details Pt educated on continued vaginal weights at home, HEP    Person(s) Educated Patient    Methods Explanation;Demonstration;Tactile cues;Verbal cues    Comprehension Verbalized understanding;Returned demonstration              PT Short Term Goals - 02/17/21 1120       PT SHORT TERM GOAL #1   Title Pt to be I with HEP    Time 6    Period Weeks    Status New    Target Date 03/31/21      PT SHORT TERM GOAL #2   Title pt to demonstrate at least 3/5 pelvic floor strength to decrease  urinary incontinence symptoms    Baseline 2/5    Time 6    Period Weeks    Status New    Target Date 03/31/21      PT SHORT TERM GOAL #3   Title pt to report no more than 2 urinary leaks per week to decrease symptoms and improve QOL.    Baseline multiple times per week    Time 6    Period Weeks    Status New    Target Date 03/31/21               PT Long Term Goals - 02/17/21 1121       PT LONG TERM GOAL #1   Title Pt to be I with advanced HEP    Time 3    Period Months    Status New    Target Date 05/20/21      PT LONG TERM GOAL #2   Title pt to demonstrate at least 4/5 pelvic floor strength to decrease urinary incontinence symptoms    Baseline 2/5    Time 3    Period Months    Status New    Target Date 05/20/21      PT LONG TERM GOAL #3   Title pt to report no more than 1 urinary leak per month to decrease symptoms and improve QOL.    Time 3    Period Months    Status New    Target Date 05/20/21      PT LONG TERM GOAL #4   Title pt to demonstrate improved coordination with breathing mechanics and pelvic floor contract/relax with activities at least 75% of the time to decrease strain on pelvic floor.    Time 3    Period Months    Target Date 05/20/21                   Plan - 03/22/21 0843     Clinical Impression Statement Pt presents to clinic reporting improvement continues with leakage but has slowed due to her not being able to do as much work as home as she is taking care of her husband post surgery this past week. Pt  session focused on functional strengthening of core and hips with breathing mechanics and pelvic floor coordination with pt needing cues for all exercies for technique and coordination. Pt tolerated well and denied any leakage. Pt would benefit from continued PT to further address urinary leakage symptoms.    Personal Factors and Comorbidities Time since onset of injury/illness/exacerbation;Comorbidity 1    Comorbidities one  vaginal birth    Examination-Activity Limitations Continence    Examination-Participation Restrictions Community Activity;Shop    Stability/Clinical Decision Making Stable/Uncomplicated    Rehab Potential Good    PT Frequency 1x / week    PT Duration Other (comment)   10   PT Treatment/Interventions ADLs/Self Care Home Management;Functional mobility training;Therapeutic activities;Therapeutic exercise;Neuromuscular re-education;Manual techniques;Patient/family education;Taping;Scar mobilization;Passive range of motion;Energy conservation;Aquatic Therapy    PT Next Visit Plan breathing mechanics with actvity    Consulted and Agree with Plan of Care Patient             Patient will benefit from skilled therapeutic intervention in order to improve the following deficits and impairments:  Decreased coordination, Decreased endurance, Impaired tone, Impaired flexibility, Improper body mechanics, Postural dysfunction, Decreased strength, Decreased mobility  Visit Diagnosis: Muscle weakness (generalized)  Lack of coordination  Abnormal posture     Problem List Patient Active Problem List   Diagnosis Date Noted   S/P thyroidectomy 09/22/2020   Thyroid cancer ([place]) 09/22/2020   Class 1 obesity with serious comorbidity and body mass index (BMI) of 32.0 to 32.9 in adult 09/15/2020   Fibroid 08/31/2020   Other hyperlipidemia 08/26/2020   Insulin resistance 08/26/2020   Vitamin D deficiency 08/26/2020   Anxiety 06/18/2019   Mild episode of recurrent major depressive disorder ([place]) 05/13/2019   Stacy Gardner, PT, DPT 12/12/228:46 AM   [facility] @ [place], [place], [address] Phone: [phone]   Fax:  [phone]  Name: Patricia Tran MRN: [id number] Date of Birth: June 03, 1970

## 2021-03-22 NOTE — Progress Notes (Signed)
Addendum: Reviewed and agree with assessment and management plan. Nevin Bloodgood, would see if barium swallow with tablet can be performed prior to EGD though I do also agree with proceeding with EGD as scheduled later this month. Shamicka Inga, Lajuan Lines, MD

## 2021-03-23 ENCOUNTER — Other Ambulatory Visit: Payer: Self-pay

## 2021-03-23 ENCOUNTER — Telehealth: Payer: Self-pay

## 2021-03-23 DIAGNOSIS — C73 Malignant neoplasm of thyroid gland: Secondary | ICD-10-CM

## 2021-03-23 DIAGNOSIS — R1310 Dysphagia, unspecified: Secondary | ICD-10-CM

## 2021-03-23 NOTE — Telephone Encounter (Signed)
-----   Message from Willia Craze, NP sent at 03/23/2021 11:24 AM EST ----- Patricia Tran,  Sorry about this high priority one. Is there anyway you can get this barium swallow done PRIOR to her EGD? She is already scheduled for both, just needs date changes. You can tell her Dr. Hilarie Fredrickson thought it may be advantageous to do esophagram first. Thanks

## 2021-03-23 NOTE — Telephone Encounter (Signed)
Spoke with the patient. She is willing to move the esophagram. She reports a very complicated schedule. She will call herself to move the appointment date to before 04/02/21. New order put in Epic to have this ASAP. Radiology is aware and have canceled the original order.

## 2021-03-24 ENCOUNTER — Other Ambulatory Visit (HOSPITAL_COMMUNITY): Payer: 59

## 2021-03-24 DIAGNOSIS — C73 Malignant neoplasm of thyroid gland: Secondary | ICD-10-CM | POA: Diagnosis not present

## 2021-03-24 DIAGNOSIS — E042 Nontoxic multinodular goiter: Secondary | ICD-10-CM | POA: Diagnosis not present

## 2021-03-29 ENCOUNTER — Encounter: Payer: Self-pay | Admitting: Physical Therapy

## 2021-03-29 ENCOUNTER — Other Ambulatory Visit: Payer: Self-pay

## 2021-03-29 ENCOUNTER — Ambulatory Visit: Payer: 59 | Admitting: Physical Therapy

## 2021-03-29 DIAGNOSIS — R279 Unspecified lack of coordination: Secondary | ICD-10-CM

## 2021-03-29 DIAGNOSIS — M6281 Muscle weakness (generalized): Secondary | ICD-10-CM

## 2021-03-29 DIAGNOSIS — R293 Abnormal posture: Secondary | ICD-10-CM

## 2021-03-29 DIAGNOSIS — R1313 Dysphagia, pharyngeal phase: Secondary | ICD-10-CM | POA: Diagnosis not present

## 2021-03-29 NOTE — Therapy (Addendum)
[facility] @ [name], [place], [address] Phone: [phone]   Fax:  [phone]  Physical Therapy Treatment  Patient Details  Name: Patricia Tran MRN: [id number] Date of Birth: 1970-10-06 Referring Provider (PT): Ma Hillock, DO   Encounter Date: 03/29/2021   PT End of Session - 03/29/21 1118     Visit Number 5    Date for PT Re-Evaluation 05/20/21    Authorization Type UMR choice    Authorization - Number of Visits 25    PT Start Time 1100    PT Stop Time 1140    PT Time Calculation (min) 40 min    Activity Tolerance Patient tolerated treatment well    Behavior During Therapy [facility] for tasks assessed/performed             Past Medical History:  Diagnosis Date   Allergy    Anemia    Anxiety    Asthma    Constipation    Depression    Diverticulitis    Fibroids 2017   left sided uterine leiomyoma    Gallbladder disease    GERD (gastroesophageal reflux disease)    Goiter    Hyperlipidemia    Joint pain    Kidney stone 08/31/2016   incidental find on CT right renal calculus   Lactose intolerance    Lump in neck    Other fatigue    Ovarian cyst 08/31/2016   1.5 cm left ovarian cyst   SOB (shortness of breath) on exertion    Swallowing difficulty    Thyroid nodule 03/2013   2.6x1.9x1.8 cm on last US thyroid 02/2015- "stable".  lg mixed schogenic nodule left thyroid. "Follicular lesion of unknown significance"    Past Surgical History:  Procedure Laterality Date   BIOPSY THYROID  06/2020   CHOLECYSTECTOMY  2011   POST OP ITCHING   TRANSAXILLERY ENDOSCOPIC LEFT THYROID LOBECTOMY WITH ROBOTIC ASSISTANCE Left 08/2020   scheduled end of May 2022.nodule- high concern for malignancy    There were no vitals filed for this visit.   Subjective Assessment - 03/29/21 1101     Subjective Pt reports she has been continuing to do HEP, did have a leak with sneezing.    Pertinent  History thyroid cancer, no radiation or chemo    How long can you sit comfortably? no limits    How long can you stand comfortably? no limits    How long can you walk comfortably? no limits    Patient Stated Goals to leak less    Currently in Pain? No/denies                               [name] Adult PT Treatment/Exercise - 03/29/21 0001       Lumbar Exercises: Standing   Other Standing Lumbar Exercises x10 palloffs blue band; rotation palloffs blue band x10      Lumbar Exercises: Seated   Sit to Stand 20 reps    Sit to Stand Limitations 10#kettlebell with exhale and kegel      Lumbar Exercises: Supine   Dead Bug 20 reps    Dead Bug Limitations red loop    Bridge 20 reps    Bridge Limitations 2x10 red loop    Basic Lumbar Stabilization Limitations opp arm/knee press 2x10      Lumbar Exercises: Quadruped   Opposite Arm/Leg Raise Right arm/Left leg;Left arm/Right leg;20  reps      Knee/Hip Exercises: Standing   Forward Lunges Both;10 reps    Side Lunges Both;10 reps    Functional Squat --    Functional Squat Limitations --                     PT Education - 03/29/21 1117     Education Details Pt educated on HEP and continued vaginal weights    Person(s) Educated Patient    Methods Explanation;Demonstration;Tactile cues;Verbal cues    Comprehension Verbalized understanding;Returned demonstration              PT Short Term Goals - 02/17/21 1120       PT SHORT TERM GOAL #1   Title Pt to be I with HEP    Time 6    Period Weeks    Status New    Target Date 03/31/21      PT SHORT TERM GOAL #2   Title pt to demonstrate at least 3/5 pelvic floor strength to decrease urinary incontinence symptoms    Baseline 2/5    Time 6    Period Weeks    Status New    Target Date 03/31/21      PT SHORT TERM GOAL #3   Title pt to report no more than 2 urinary leaks per week to decrease symptoms and improve QOL.    Baseline multiple times per  week    Time 6    Period Weeks    Status New    Target Date 03/31/21               PT Long Term Goals - 02/17/21 1121       PT LONG TERM GOAL #1   Title Pt to be I with advanced HEP    Time 3    Period Months    Status New    Target Date 05/20/21      PT LONG TERM GOAL #2   Title pt to demonstrate at least 4/5 pelvic floor strength to decrease urinary incontinence symptoms    Baseline 2/5    Time 3    Period Months    Status New    Target Date 05/20/21      PT LONG TERM GOAL #3   Title pt to report no more than 1 urinary leak per month to decrease symptoms and improve QOL.    Time 3    Period Months    Status New    Target Date 05/20/21      PT LONG TERM GOAL #4   Title pt to demonstrate improved coordination with breathing mechanics and pelvic floor contract/relax with activities at least 75% of the time to decrease strain on pelvic floor.    Time 3    Period Months    Target Date 05/20/21                   Plan - 03/29/21 1118     Clinical Impression Statement Pt presents to clinic reporting only one leak with a sneeze and continues to improve with vaginal weight HEP at home and feels more coordinating with size 2. Pt session focused on functional strengthening of core and hips with breathing mechanics and pelvic floor coordination with pt needing cues for all exercies for technique and coordination. Pt tolerated well and denied any leakage. Pt would benefit from continued PT to further address urinary leakage symptoms.    Personal Factors and  Comorbidities Time since onset of injury/illness/exacerbation;Comorbidity 1    Comorbidities one vaginal birth    Examination-Activity Limitations Continence    Examination-Participation Restrictions Community Activity;Shop    Stability/Clinical Decision Making Stable/Uncomplicated    Rehab Potential Good    PT Frequency 1x / week    PT Duration Other (comment)   10   PT Treatment/Interventions ADLs/Self Care  Home Management;Functional mobility training;Therapeutic activities;Therapeutic exercise;Neuromuscular re-education;Manual techniques;Patient/family education;Taping;Scar mobilization;Passive range of motion;Energy conservation;Aquatic Therapy    PT Next Visit Plan hip/core strength with pelvic floor coordination    Consulted and Agree with Plan of Care Patient             Patient will benefit from skilled therapeutic intervention in order to improve the following deficits and impairments:  Decreased coordination, Decreased endurance, Impaired tone, Impaired flexibility, Improper body mechanics, Postural dysfunction, Decreased strength, Decreased mobility  Visit Diagnosis: Muscle weakness (generalized)  Lack of coordination  Abnormal posture     Problem List Patient Active Problem List   Diagnosis Date Noted   S/P thyroidectomy 09/22/2020   Thyroid cancer ([place]) 09/22/2020   Class 1 obesity with serious comorbidity and body mass index (BMI) of 32.0 to 32.9 in adult 09/15/2020   Fibroid 08/31/2020   Other hyperlipidemia 08/26/2020   Insulin resistance 08/26/2020   Vitamin D deficiency 08/26/2020   Anxiety 06/18/2019   Mild episode of recurrent major depressive disorder ([place]) 05/13/2019    Stacy Gardner, PT, DPT 03/29/2210:40 AM   PHYSICAL THERAPY DISCHARGE SUMMARY  Visits from Start of Care: 5  Current functional level related to goals / functional outcomes: Unable to reassess as pt has not returned since last visit   Remaining deficits: Unknown    Education / Equipment: HEP   Patient agrees to discharge. Patient goals were partially met. Patient is being discharged due to not returning since the last visit.   Stacy Gardner, PT, DPT 05/23/234:08 PM    [facility] @ [place], [place], [address] Phone: [phone]   Fax:  [phone]  Name: Patricia Tran MRN: [id number] Date of  Birth: 03-Apr-1971

## 2021-03-30 ENCOUNTER — Ambulatory Visit (HOSPITAL_COMMUNITY)
Admission: RE | Admit: 2021-03-30 | Discharge: 2021-03-30 | Disposition: A | Discharge status: Home / Self Care | Payer: 59 | Source: Ambulatory Visit | Attending: Nurse Practitioner | Admitting: Nurse Practitioner

## 2021-03-30 DIAGNOSIS — K224 Dyskinesia of esophagus: Secondary | ICD-10-CM | POA: Diagnosis not present

## 2021-03-30 DIAGNOSIS — C73 Malignant neoplasm of thyroid gland: Secondary | ICD-10-CM | POA: Diagnosis not present

## 2021-03-30 DIAGNOSIS — R1310 Dysphagia, unspecified: Secondary | ICD-10-CM | POA: Insufficient documentation

## 2021-03-31 ENCOUNTER — Other Ambulatory Visit (HOSPITAL_COMMUNITY): Payer: 59

## 2021-04-02 ENCOUNTER — Ambulatory Visit (AMBULATORY_SURGERY_CENTER): Payer: 59 | Admitting: Internal Medicine

## 2021-04-02 ENCOUNTER — Encounter: Payer: Self-pay | Admitting: Internal Medicine

## 2021-04-02 VITALS — BP 113/92 | HR 93 | Temp 97.3°F | Resp 20 | Ht 63.0 in | Wt 172.0 lb

## 2021-04-02 DIAGNOSIS — R1319 Other dysphagia: Secondary | ICD-10-CM | POA: Diagnosis not present

## 2021-04-02 DIAGNOSIS — R933 Abnormal findings on diagnostic imaging of other parts of digestive tract: Secondary | ICD-10-CM

## 2021-04-02 DIAGNOSIS — K222 Esophageal obstruction: Secondary | ICD-10-CM

## 2021-04-02 DIAGNOSIS — K317 Polyp of stomach and duodenum: Secondary | ICD-10-CM | POA: Diagnosis not present

## 2021-04-02 DIAGNOSIS — R1310 Dysphagia, unspecified: Secondary | ICD-10-CM | POA: Diagnosis not present

## 2021-04-02 MED ORDER — SODIUM CHLORIDE 0.9 % IV SOLN: 500.0000 mL | Freq: Once | INTRAVENOUS | Status: DC

## 2021-04-02 NOTE — Progress Notes (Signed)
Called to room to assist during endoscopic procedure.  Patient ID and intended procedure confirmed with present staff. Received instructions for my participation in the procedure from the performing physician.  

## 2021-04-02 NOTE — Progress Notes (Signed)
Pt in recovery with monitors in place, VSS. Report given to receiving RN. Bite guard was placed with pt awake to ensure comfort. No dental or soft tissue damage noted. 

## 2021-04-02 NOTE — Patient Instructions (Signed)
Handouts on esophageal stricture, hiatal hernia, and post esophageal dilation diet.  YOU HAD AN ENDOSCOPIC PROCEDURE TODAY AT [facility]:   Refer to the procedure report that was given to you for any specific questions about what was found during the examination.  If the procedure report does not answer your questions, please call your gastroenterologist to clarify.  If you requested that your care partner not be given the details of your procedure findings, then the procedure report has been included in a sealed envelope for you to review at your convenience later.  YOU SHOULD EXPECT: Some feelings of bloating in the abdomen. Passage of more gas than usual.  Walking can help get rid of the air that was put into your GI tract during the procedure and reduce the bloating. If you had a lower endoscopy (such as a colonoscopy or flexible sigmoidoscopy) you may notice spotting of blood in your stool or on the toilet paper. If you underwent a bowel prep for your procedure, you may not have a normal bowel movement for a few days.  Please Note:  You might notice some irritation and congestion in your nose or some drainage.  This is from the oxygen used during your procedure.  There is no need for concern and it should clear up in a day or so.  SYMPTOMS TO REPORT IMMEDIATELY:  Following upper endoscopy (EGD)  Vomiting of blood or coffee ground material  New chest pain or pain under the shoulder blades  Painful or persistently difficult swallowing  New shortness of breath  Fever of 100F or higher  Black, tarry-looking stools  For urgent or emergent issues, a gastroenterologist can be reached at any hour by calling [phone]. Do not use MyChart messaging for urgent concerns.    DIET:  Nothing by mouth until 11:20 a.m.  Clear liquids for 1 hour afterwards, then progress to a soft diet for the rest of the day.  You may proceed to your regular diet tomorrow.  Drink plenty of fluids  but you should avoid alcoholic beverages for 24 hours.  ACTIVITY:  You should plan to take it easy for the rest of today and you should NOT DRIVE or use heavy machinery until tomorrow (because of the sedation medicines used during the test).    FOLLOW UP: Our staff will call the number listed on your records 48-72 hours following your procedure to check on you and address any questions or concerns that you may have regarding the information given to you following your procedure. If we do not reach you, we will leave a message.  We will attempt to reach you two times.  During this call, we will ask if you have developed any symptoms of COVID 19. If you develop any symptoms (ie: fever, flu-like symptoms, shortness of breath, cough etc.) before then, please call [phone].  If you test positive for Covid 19 in the 2 weeks post procedure, please call and report this information to [place].    If any biopsies were taken you will be contacted by phone or by letter within the next 1-3 weeks.  Please call us at [phone] if you have not heard about the biopsies in 3 weeks.    SIGNATURES/CONFIDENTIALITY: You and/or your care partner have signed paperwork which will be entered into your electronic medical record.  These signatures attest to the fact that that the information above on your After Visit Summary has been reviewed and is understood.  Full  responsibility of the confidentiality of this discharge information lies with you and/or your care-partner.

## 2021-04-02 NOTE — Progress Notes (Signed)
Pt's states no medical or surgical changes since previsit or office visit.  ° °CHECK-IN-AM ° °V/S-CW °

## 2021-04-02 NOTE — Op Note (Signed)
[place] Patient Name: Patricia Tran Procedure Date: 04/02/2021 9:56 AM MRN: [id number] Endoscopist: Jerene Bears , MD Age: 50 Referring MD:  Date of Birth: Jun 06, 1970 Gender: Female Account #: [id number] Procedure:                Upper GI endoscopy Indications:              Dysphagia - predominantly to pills, Abnormal                            cine-esophagram (distal esophageal stricture and                            suggestion of gastric ulcers) Medicines:                Monitored Anesthesia Care Procedure:                Pre-Anesthesia Assessment:                           - Prior to the procedure, a History and Physical                            was performed, and patient medications and                            allergies were reviewed. The patient's tolerance of                            previous anesthesia was also reviewed. The risks                            and benefits of the procedure and the sedation                            options and risks were discussed with the patient.                            All questions were answered, and informed consent                            was obtained. Prior Anticoagulants: The patient has                            taken no previous anticoagulant or antiplatelet                            agents. ASA Grade Assessment: II - A patient with                            mild systemic disease. After reviewing the risks                            and benefits, the patient was deemed in  satisfactory condition to undergo the procedure.                           After obtaining informed consent, the endoscope was                            passed under direct vision. Throughout the                            procedure, the patient's blood pressure, pulse, and                            oxygen saturations were monitored continuously. The                            Endoscope was  introduced through the mouth, and                            advanced to the second part of duodenum. The upper                            GI endoscopy was accomplished without difficulty.                            The patient tolerated the procedure well. Scope In: Scope Out: Findings:                 Two benign-appearing, intrinsic moderate                            (circumferential scarring or stenosis; an endoscope                            may pass) stenoses were found 38 to 39 cm from the                            incisors. The narrowest stenosis measured 1.3 cm                            (inner diameter) x less than one cm (in length).                            The stenoses were traversed. A TTS dilator was                            passed through the scope. Dilation with a 16-17-18                            mm balloon dilator was performed to 18 mm. The                            dilation site was examined and showed moderate  mucosal disruption.                           A 1 to 2 cm hiatal hernia was present.                           Multiple 3 to 9 mm pedunculated and sessile polyps                            with no bleeding were found in the gastric fundus                            and in the gastric body. These polyps appear benign                            and most consistent with fundic gland polyps.                            Biopsies (multiple of multiple different                            polyps)were taken as sampling with a cold forceps                            for histology.                           The exam of the stomach was otherwise normal.                           The examined duodenum was normal. Complications:            No immediate complications. Estimated Blood Loss:     Estimated blood loss was minimal. Impression:               - Benign-appearing esophageal stenoses. Dilated to                            18 mm.                            - 1 to 2 cm hiatal hernia.                           - Multiple gastric polyps. Biopsied.                           - Normal examined duodenum. Recommendation:           - Patient has a contact number available for                            emergencies. The signs and symptoms of potential                            delayed complications were discussed with the  patient. Return to normal activities tomorrow.                            Written discharge instructions were provided to the                            patient.                           - Post-dilation diet and then advance diet as                            tolerated.                           - Continue present medications.                           - Await pathology results. Jerene Bears, MD 04/02/2021 10:29:24 AM This report has been signed electronically.

## 2021-04-02 NOTE — Progress Notes (Signed)
Please see office note dated 03/15/2021 for details.  Patient seen by Tye Savoy, NP to evaluate dysphagia symptom.  Barium esophagram was performed after this visit which showed distal esophageal stricture, small hiatal hernia and reflux  Patient presents today for EGD. No significant changes in medical history since that time and she remains appropriate for outpatient ambulatory upper endoscopy today.

## 2021-04-05 ENCOUNTER — Other Ambulatory Visit (INDEPENDENT_AMBULATORY_CARE_PROVIDER_SITE_OTHER): Payer: Self-pay | Admitting: Family Medicine

## 2021-04-05 DIAGNOSIS — E559 Vitamin D deficiency, unspecified: Secondary | ICD-10-CM

## 2021-04-05 DIAGNOSIS — E8881 Metabolic syndrome: Secondary | ICD-10-CM

## 2021-04-06 ENCOUNTER — Other Ambulatory Visit (HOSPITAL_COMMUNITY): Payer: Self-pay

## 2021-04-06 ENCOUNTER — Other Ambulatory Visit (HOSPITAL_COMMUNITY): Payer: 59

## 2021-04-07 ENCOUNTER — Other Ambulatory Visit (HOSPITAL_COMMUNITY): Payer: Self-pay

## 2021-04-07 ENCOUNTER — Telehealth: Payer: Self-pay | Admitting: *Deleted

## 2021-04-07 NOTE — Telephone Encounter (Signed)
°  Follow up Call-  Call back number 04/02/2021 11/25/2020  Post procedure Call Back phone  # [phone]  Permission to leave phone message Yes Yes  Some recent data might be hidden     Patient questions:  Do you have a fever, pain , or abdominal swelling? No. Pain Score  0 *  Have you tolerated food without any problems? Yes.    Have you been able to return to your normal activities? Yes.    Do you have any questions about your discharge instructions: Diet   No. Medications  No. Follow up visit  No.  Do you have questions or concerns about your Care? No.  Actions: * If pain score is 4 or above: No action needed, pain <4.

## 2021-04-08 ENCOUNTER — Other Ambulatory Visit (HOSPITAL_COMMUNITY): Payer: Self-pay

## 2021-04-13 ENCOUNTER — Encounter: Payer: Self-pay | Admitting: Internal Medicine

## 2021-04-13 ENCOUNTER — Other Ambulatory Visit: Payer: Self-pay

## 2021-04-13 ENCOUNTER — Encounter: Payer: 59 | Admitting: Physical Therapy

## 2021-04-13 ENCOUNTER — Encounter (INDEPENDENT_AMBULATORY_CARE_PROVIDER_SITE_OTHER): Payer: Self-pay | Admitting: Family Medicine

## 2021-04-13 ENCOUNTER — Other Ambulatory Visit (HOSPITAL_COMMUNITY): Payer: Self-pay

## 2021-04-13 ENCOUNTER — Ambulatory Visit (INDEPENDENT_AMBULATORY_CARE_PROVIDER_SITE_OTHER): Payer: 59 | Admitting: Family Medicine

## 2021-04-13 VITALS — BP 131/80 | HR 111 | Temp 98.2°F | Ht 63.0 in | Wt 160.0 lb

## 2021-04-13 DIAGNOSIS — Z9189 Other specified personal risk factors, not elsewhere classified: Secondary | ICD-10-CM

## 2021-04-13 DIAGNOSIS — Z6832 Body mass index (BMI) 32.0-32.9, adult: Secondary | ICD-10-CM | POA: Diagnosis not present

## 2021-04-13 DIAGNOSIS — R7303 Prediabetes: Secondary | ICD-10-CM

## 2021-04-13 DIAGNOSIS — E669 Obesity, unspecified: Secondary | ICD-10-CM | POA: Diagnosis not present

## 2021-04-13 DIAGNOSIS — E559 Vitamin D deficiency, unspecified: Secondary | ICD-10-CM | POA: Diagnosis not present

## 2021-04-13 DIAGNOSIS — E66811 Obesity, class 1: Secondary | ICD-10-CM

## 2021-04-13 MED ORDER — VITAMIN D (ERGOCALCIFEROL) 1.25 MG (50000 UNIT) PO CAPS
50000.0000 [IU] | ORAL_CAPSULE | ORAL | 0 refills | Status: DC
  Filled 2021-04-13: qty 4, 28d supply, fill #0

## 2021-04-13 MED ORDER — TIRZEPATIDE 2.5 MG/0.5ML ~~LOC~~ SOAJ
2.5000 mg | SUBCUTANEOUS | 0 refills | Status: DC
  Filled 2021-04-13: qty 2, 28d supply, fill #0

## 2021-04-14 NOTE — Progress Notes (Signed)
Chief Complaint:   OBESITY Patricia Tran is here to discuss her progress with her obesity treatment plan along with follow-up of her obesity related diagnoses. Patricia Tran is on the Category 2 Plan and states she is following her eating plan approximately 40% of the time. Patricia Tran states she is doing 0 minutes 0 times per week.  Today's visit was #: 14 Starting weight: 185 lbs Starting date: 08/11/2020 Today's weight: 160 lbs Today's date: 04/13/2021 Total lbs lost to date: 25 Total lbs lost since last in-office visit: 5  Interim History: Patricia Tran continues to do well with diet and exercise, even over the holidays. She notes her hunger is controlled and she is doing well with minimizing non-hunger snacking.   Subjective:   1. Pre-diabetes Patricia Tran continues to do very well on Mounjaro. She notes decreased polyphagia with minimizing GI upset.  2. Vitamin D deficiency Patricia Tran on Vit D, and no side effects noted.  3. At risk for impaired metabolic function Patricia Tran is at increased risk for impaired metabolic function if protein decreases and exercise.   Assessment/Plan:   1. Pre-diabetes We will refill Mounjaro for 1 month. Delane will continue to work on weight loss, exercise, and decreasing simple carbohydrates to help decrease the risk of diabetes.   - tirzepatide [facility]) 2.5 MG/0.5ML Pen; Inject 2.5 mg into the skin once a week.  Dispense: 2 mL; Refill: 0  2. Vitamin D deficiency Low Vitamin D level contributes to fatigue and are associated with obesity, breast, and colon cancer. We will refill prescription Vitamin D for 1 month. Chance will follow-up for routine testing of Vitamin D, at least 2-3 times per year to avoid over-replacement.  - Vitamin D, Ergocalciferol, (DRISDOL) 1.25 MG (50000 UNIT) CAPS capsule; Take 1 capsule (50,000 Units total) by mouth every 7 (seven) days.  Dispense: 4 capsule; Refill: 0  3. At risk for impaired metabolic function Patricia Tran was given approximately 15 minutes of impaired   metabolic function prevention counseling today. We discussed intensive lifestyle modifications today with an emphasis on specific nutrition and exercise instructions and strategies.   Repetitive spaced learning was employed today to elicit superior memory formation and behavioral change.  4. Obesity BMI today is Patricia Tran is currently in the action stage of change. As such, her goal is to continue with weight loss efforts. She has agreed to the Category 2 Plan.   Behavioral modification strategies: increasing water intake and meal planning and cooking strategies.  Patricia Tran has agreed to follow-up with our clinic in 4 weeks. She was informed of the importance of frequent follow-up visits to maximize her success with intensive lifestyle modifications for her multiple health conditions.   Objective:   Blood pressure 131/80, pulse (!) 111, temperature 98.2 F (36.8 C), temperature source Oral, height 5\' 3"  (1.6 m), weight 160 lb (72.6 kg), last menstrual period 04/02/2021, SpO2 99 %. Body mass index is 28.34 kg/m.  General: Cooperative, alert, well developed, in no acute distress. HEENT: Conjunctivae and lids unremarkable. Cardiovascular: Regular rhythm.  Lungs: Normal work of breathing. Neurologic: No focal deficits.   Lab Results  Component Value Date   CREATININE 0.81 12/01/2020   BUN 12 12/01/2020   NA 140 12/01/2020   K 4.6 12/01/2020   CL 105 12/01/2020   CO2 22 12/01/2020   Lab Results  Component Value Date   ALT 24 12/01/2020   AST 20 12/01/2020   ALKPHOS 102 12/01/2020   BILITOT 0.3 12/01/2020   Lab Results  Component  Value Date   HGBA1C 5.5 12/01/2020   HGBA1C 5.4 08/11/2020   HGBA1C 5.4 10/01/2018   Lab Results  Component Value Date   INSULIN 9.0 12/01/2020   INSULIN 10.2 08/11/2020   Lab Results  Component Value Date   TSH 1.260 03/11/2021   Lab Results  Component Value Date   CHOL 194 12/01/2020   HDL 38 (L) 12/01/2020   LDLCALC 136 (H) 12/01/2020   TRIG  109 12/01/2020   Lab Results  Component Value Date   VD25OH 57.0 12/01/2020   VD25OH 33.7 08/11/2020   Lab Results  Component Value Date   WBC 6.6 08/11/2020   HGB 12.6 08/11/2020   HCT 39.9 08/11/2020   MCV 87 08/11/2020   PLT 331 10/01/2018   No results found for: IRON, TIBC, FERRITIN  Attestation Statements:   Reviewed by clinician on day of visit: allergies, medications, problem list, medical history, surgical history, family history, social history, and previous encounter notes.   I, Trixie Dredge, am acting as transcriptionist for Dennard Nip, MD.  I have reviewed the above documentation for accuracy and completeness, and I agree with the above. -  Dennard Nip, MD

## 2021-04-19 ENCOUNTER — Ambulatory Visit: Payer: 59

## 2021-04-19 ENCOUNTER — Encounter: Payer: 59 | Admitting: Physical Therapy

## 2021-04-26 ENCOUNTER — Encounter: Payer: 59 | Admitting: Physical Therapy

## 2021-05-04 ENCOUNTER — Emergency Department (HOSPITAL_BASED_OUTPATIENT_CLINIC_OR_DEPARTMENT_OTHER): Payer: 59

## 2021-05-04 ENCOUNTER — Other Ambulatory Visit: Payer: Self-pay

## 2021-05-04 ENCOUNTER — Encounter (HOSPITAL_BASED_OUTPATIENT_CLINIC_OR_DEPARTMENT_OTHER): Payer: Self-pay

## 2021-05-04 ENCOUNTER — Inpatient Hospital Stay (HOSPITAL_BASED_OUTPATIENT_CLINIC_OR_DEPARTMENT_OTHER)
Admission: EM | Admit: 2021-05-04 | Discharge: 2021-05-06 | DRG: 389 | Disposition: A | Discharge status: Home / Self Care | Payer: 59 | Attending: Internal Medicine | Admitting: Internal Medicine

## 2021-05-04 DIAGNOSIS — E876 Hypokalemia: Secondary | ICD-10-CM | POA: Diagnosis not present

## 2021-05-04 DIAGNOSIS — Z885 Allergy status to narcotic agent status: Secondary | ICD-10-CM

## 2021-05-04 DIAGNOSIS — E785 Hyperlipidemia, unspecified: Secondary | ICD-10-CM | POA: Diagnosis present

## 2021-05-04 DIAGNOSIS — K5669 Other partial intestinal obstruction: Secondary | ICD-10-CM | POA: Diagnosis not present

## 2021-05-04 DIAGNOSIS — R0789 Other chest pain: Secondary | ICD-10-CM | POA: Diagnosis not present

## 2021-05-04 DIAGNOSIS — R9389 Abnormal findings on diagnostic imaging of other specified body structures: Secondary | ICD-10-CM | POA: Diagnosis present

## 2021-05-04 DIAGNOSIS — D75839 Thrombocytosis, unspecified: Secondary | ICD-10-CM | POA: Diagnosis present

## 2021-05-04 DIAGNOSIS — Z85850 Personal history of malignant neoplasm of thyroid: Secondary | ICD-10-CM | POA: Diagnosis not present

## 2021-05-04 DIAGNOSIS — K222 Esophageal obstruction: Secondary | ICD-10-CM | POA: Diagnosis present

## 2021-05-04 DIAGNOSIS — D62 Acute posthemorrhagic anemia: Secondary | ICD-10-CM | POA: Diagnosis present

## 2021-05-04 DIAGNOSIS — Z9889 Other specified postprocedural states: Secondary | ICD-10-CM | POA: Diagnosis not present

## 2021-05-04 DIAGNOSIS — E890 Postprocedural hypothyroidism: Secondary | ICD-10-CM | POA: Diagnosis not present

## 2021-05-04 DIAGNOSIS — Z8371 Family history of colonic polyps: Secondary | ICD-10-CM

## 2021-05-04 DIAGNOSIS — C73 Malignant neoplasm of thyroid gland: Secondary | ICD-10-CM | POA: Diagnosis present

## 2021-05-04 DIAGNOSIS — D259 Leiomyoma of uterus, unspecified: Secondary | ICD-10-CM | POA: Diagnosis present

## 2021-05-04 DIAGNOSIS — N920 Excessive and frequent menstruation with regular cycle: Secondary | ICD-10-CM | POA: Diagnosis not present

## 2021-05-04 DIAGNOSIS — E669 Obesity, unspecified: Secondary | ICD-10-CM | POA: Diagnosis present

## 2021-05-04 DIAGNOSIS — Z6828 Body mass index (BMI) 28.0-28.9, adult: Secondary | ICD-10-CM

## 2021-05-04 DIAGNOSIS — Z841 Family history of disorders of kidney and ureter: Secondary | ICD-10-CM | POA: Diagnosis not present

## 2021-05-04 DIAGNOSIS — K219 Gastro-esophageal reflux disease without esophagitis: Secondary | ICD-10-CM | POA: Diagnosis present

## 2021-05-04 DIAGNOSIS — K56600 Partial intestinal obstruction, unspecified as to cause: Secondary | ICD-10-CM | POA: Diagnosis not present

## 2021-05-04 DIAGNOSIS — Z791 Long term (current) use of non-steroidal anti-inflammatories (NSAID): Secondary | ICD-10-CM | POA: Diagnosis not present

## 2021-05-04 DIAGNOSIS — K831 Obstruction of bile duct: Secondary | ICD-10-CM | POA: Diagnosis not present

## 2021-05-04 DIAGNOSIS — Z20822 Contact with and (suspected) exposure to covid-19: Secondary | ICD-10-CM | POA: Diagnosis present

## 2021-05-04 DIAGNOSIS — R109 Unspecified abdominal pain: Secondary | ICD-10-CM | POA: Diagnosis not present

## 2021-05-04 DIAGNOSIS — K317 Polyp of stomach and duodenum: Secondary | ICD-10-CM | POA: Diagnosis present

## 2021-05-04 DIAGNOSIS — Z8249 Family history of ischemic heart disease and other diseases of the circulatory system: Secondary | ICD-10-CM | POA: Diagnosis not present

## 2021-05-04 DIAGNOSIS — Z79899 Other long term (current) drug therapy: Secondary | ICD-10-CM

## 2021-05-04 DIAGNOSIS — K56609 Unspecified intestinal obstruction, unspecified as to partial versus complete obstruction: Secondary | ICD-10-CM | POA: Diagnosis present

## 2021-05-04 DIAGNOSIS — R933 Abnormal findings on diagnostic imaging of other parts of digestive tract: Secondary | ICD-10-CM | POA: Diagnosis not present

## 2021-05-04 DIAGNOSIS — Z9049 Acquired absence of other specified parts of digestive tract: Secondary | ICD-10-CM | POA: Diagnosis not present

## 2021-05-04 DIAGNOSIS — Z79890 Hormone replacement therapy (postmenopausal): Secondary | ICD-10-CM | POA: Diagnosis not present

## 2021-05-04 DIAGNOSIS — R1110 Vomiting, unspecified: Secondary | ICD-10-CM | POA: Diagnosis not present

## 2021-05-04 DIAGNOSIS — D509 Iron deficiency anemia, unspecified: Secondary | ICD-10-CM | POA: Diagnosis not present

## 2021-05-04 DIAGNOSIS — F419 Anxiety disorder, unspecified: Secondary | ICD-10-CM | POA: Diagnosis present

## 2021-05-04 DIAGNOSIS — Z833 Family history of diabetes mellitus: Secondary | ICD-10-CM | POA: Diagnosis not present

## 2021-05-04 HISTORY — DX: Unspecified intestinal obstruction, unspecified as to partial versus complete obstruction: K56.609

## 2021-05-04 HISTORY — DX: Malignant neoplasm of thyroid gland: C73

## 2021-05-04 LAB — CBC WITH DIFFERENTIAL/PLATELET
Abs Immature Granulocytes: 0.02 10*3/uL (ref 0.00–0.07)
Basophils Absolute: 0 10*3/uL (ref 0.0–0.1)
Basophils Relative: 0 %
Eosinophils Absolute: 0 10*3/uL (ref 0.0–0.5)
Eosinophils Relative: 0 %
HCT: 37.7 % (ref 36.0–46.0)
Hemoglobin: 12.1 g/dL (ref 12.0–15.0)
Immature Granulocytes: 0 %
Lymphocytes Relative: 14 %
Lymphs Abs: 1.1 10*3/uL (ref 0.7–4.0)
MCH: 24.3 pg — ABNORMAL LOW (ref 26.0–34.0)
MCHC: 32.1 g/dL (ref 30.0–36.0)
MCV: 75.9 fL — ABNORMAL LOW (ref 80.0–100.0)
Monocytes Absolute: 0.4 10*3/uL (ref 0.1–1.0)
Monocytes Relative: 5 %
Neutro Abs: 6.6 10*3/uL (ref 1.7–7.7)
Neutrophils Relative %: 81 %
Platelets: 442 10*3/uL — ABNORMAL HIGH (ref 150–400)
RBC: 4.97 MIL/uL (ref 3.87–5.11)
RDW: 17.1 % — ABNORMAL HIGH (ref 11.5–15.5)
WBC: 8.2 10*3/uL (ref 4.0–10.5)
nRBC: 0 % (ref 0.0–0.2)

## 2021-05-04 LAB — LIPASE, BLOOD: Lipase: 16 U/L (ref 11–51)

## 2021-05-04 LAB — TROPONIN I (HIGH SENSITIVITY)
Troponin I (High Sensitivity): 2 ng/L (ref <18)
Troponin I (High Sensitivity): 3 ng/L (ref <18)

## 2021-05-04 LAB — COMPREHENSIVE METABOLIC PANEL
ALT: 37 U/L (ref 0–44)
AST: 32 U/L (ref 15–41)
Albumin: 4.2 g/dL (ref 3.5–5.0)
Alkaline Phosphatase: 120 U/L (ref 38–126)
Anion gap: 13 (ref 5–15)
BUN: 15 mg/dL (ref 6–20)
CO2: 24 mmol/L (ref 22–32)
Calcium: 9.9 mg/dL (ref 8.9–10.3)
Chloride: 102 mmol/L (ref 98–111)
Creatinine, Ser: 0.99 mg/dL (ref 0.44–1.00)
GFR, Estimated: >60 mL/min (ref >60)
Glucose, Bld: 108 mg/dL — ABNORMAL HIGH (ref 70–99)
Potassium: 4.5 mmol/L (ref 3.5–5.1)
Sodium: 139 mmol/L (ref 135–145)
Total Bilirubin: 0.5 mg/dL (ref 0.3–1.2)
Total Protein: 8 g/dL (ref 6.5–8.1)

## 2021-05-04 LAB — RESP PANEL BY RT-PCR (FLU A&B, COVID) ARPGX2
Influenza A by PCR: NEGATIVE
Influenza B by PCR: NEGATIVE
SARS Coronavirus 2 by RT PCR: NEGATIVE

## 2021-05-04 LAB — SURGICAL PCR SCREEN
MRSA, PCR: NEGATIVE
Staphylococcus aureus: NEGATIVE

## 2021-05-04 LAB — URINALYSIS, ROUTINE W REFLEX MICROSCOPIC
Bilirubin Urine: NEGATIVE
Glucose, UA: NEGATIVE mg/dL
Hgb urine dipstick: NEGATIVE
Ketones, ur: 15 mg/dL — AB
Leukocytes,Ua: NEGATIVE
Nitrite: NEGATIVE
Protein, ur: 30 mg/dL — AB
Specific Gravity, Urine: 1.017 (ref 1.005–1.030)
pH: 8.5 — ABNORMAL HIGH (ref 5.0–8.0)

## 2021-05-04 LAB — PREGNANCY, URINE: Preg Test, Ur: NEGATIVE

## 2021-05-04 MED ORDER — SODIUM CHLORIDE 0.9 % IV SOLN
25.0000 mg | Freq: Four times a day (QID) | INTRAVENOUS | Status: DC | PRN
  Administered 2021-05-04: 14:00:00 25 mg via INTRAVENOUS
  Filled 2021-05-04 (×2): qty 1

## 2021-05-04 MED ORDER — SODIUM CHLORIDE 0.9 % IV SOLN
12.5000 mg | Freq: Four times a day (QID) | INTRAVENOUS | Status: DC | PRN
  Filled 2021-05-04: qty 0.5

## 2021-05-04 MED ORDER — PROMETHAZINE HCL 25 MG/ML IJ SOLN
INTRAMUSCULAR | Status: AC
  Filled 2021-05-04: qty 1

## 2021-05-04 MED ORDER — LACTATED RINGERS IV SOLN: INTRAVENOUS | Status: DC

## 2021-05-04 MED ORDER — IOHEXOL 300 MG/ML  SOLN
80.0000 mL | Freq: Once | INTRAMUSCULAR | Status: AC | PRN
  Administered 2021-05-04: 08:00:00 80 mL via INTRAVENOUS

## 2021-05-04 MED ORDER — MORPHINE SULFATE (PF) 2 MG/ML IV SOLN: 1.0000 mg | INTRAVENOUS | Status: DC | PRN

## 2021-05-04 MED ORDER — METOCLOPRAMIDE HCL 5 MG/ML IJ SOLN
10.0000 mg | Freq: Once | INTRAMUSCULAR | Status: AC
  Administered 2021-05-04: 09:00:00 10 mg via INTRAVENOUS
  Filled 2021-05-04: qty 2

## 2021-05-04 MED ORDER — ONDANSETRON HCL 4 MG/2ML IJ SOLN: 4.0000 mg | Freq: Four times a day (QID) | INTRAMUSCULAR | Status: DC | PRN

## 2021-05-04 MED ORDER — LORAZEPAM 2 MG/ML IJ SOLN
1.0000 mg | Freq: Once | INTRAMUSCULAR | Status: AC
  Administered 2021-05-04: 11:00:00 1 mg via INTRAVENOUS
  Filled 2021-05-04: qty 1

## 2021-05-04 MED ORDER — LORAZEPAM 2 MG/ML IJ SOLN
0.5000 mg | Freq: Four times a day (QID) | INTRAMUSCULAR | Status: DC | PRN
  Administered 2021-05-04: 0.5 mg via INTRAVENOUS
  Filled 2021-05-04: qty 1

## 2021-05-04 MED ORDER — ONDANSETRON HCL 4 MG/2ML IJ SOLN
4.0000 mg | Freq: Once | INTRAMUSCULAR | Status: AC
Start: 2021-05-04 — End: 2021-05-04
  Administered 2021-05-04: 07:00:00 4 mg via INTRAVENOUS
  Filled 2021-05-04: qty 2

## 2021-05-04 MED ORDER — SODIUM CHLORIDE 0.9 % IV SOLN
25.0000 mg | Freq: Four times a day (QID) | INTRAVENOUS | Status: DC | PRN
  Administered 2021-05-04: 07:00:00 25 mg via INTRAVENOUS
  Filled 2021-05-04: qty 1

## 2021-05-04 MED ORDER — SODIUM CHLORIDE 0.9 % IV BOLUS
1000.0000 mL | Freq: Once | INTRAVENOUS | Status: AC
  Administered 2021-05-04: 07:00:00 1000 mL via INTRAVENOUS

## 2021-05-04 NOTE — Assessment & Plan Note (Addendum)
51 year old female presenting with abdominal pain and intractable vomiting found to have SBO -Admit to MedSurg and make n.p.o. -no clinical findings for emergent surgery at this time  -Gentle IV fluid hydration -Pain medication with morphine 1mg  q3 hours prn, she is currently pain free  -encourage ambulation -Monitor electrolytes -General surgery consulted and has seen patient. Manage conservatively, okay for ice chips.  -She has not been vomiting while here so no NG tube placed at this time, if any vomiting, will need NG tube.

## 2021-05-04 NOTE — H&P (Signed)
History and Physical    Patricia Tran ZCH:[id number] DOB: 01/15/1971 DOA: 05/04/2021  PCP: Ma Hillock, DO Consultants:  endocrinology: DR. D'Allessi, Dr. Leafy Ro HWW, GI: Dr. Hilarie Fredrickson  Patient coming from: [place]  lives at home with her husband  Chief Complaint: vomiting and stomach pain   HPI: Patricia Tran is a 51 y.o. female with medical history significant of thyroid cancer s/p thyroidectomy, anxiety and depression, esophageal stenosis, uterine fibroids who presented to ED with complaints of stomach pain and intractable vomiting that started around 6pm yesterday evening. She states she started to have pain in her epigastric area rated as a 5-7/10, dull with no radiation. She took a tramadol with no relief and later on in night began to vomit. Vomit initially clear, then yellow then dark in color. Due to pain and vomiting she came to ED.  Her last normal BM was on Sunday, but she can't really remember. She has not been passing any gas since yesterday. History of cholecystectomy. Recent colonoscopy in 11/2020.   She has been feeling fine until yesterday. Denies any fever/chills, headaches, chest pain/palpitations, shortness of breath/cough, dysuria, leg swelling or rashes.   She is now pain free and feeling much better.     ED Course: vitals: temp: 97.5, bp: 122/85, HR: 106, RR: 18, oxygen: 98%RA Pertinent labs: mcv: 75.9, platelets 442,  CT abdomen/pelvis: partial SBO with transition in the left upper quadrant  Thickened endometrium (14 mm) for postmenopausal age with either high-density or enhancing material. The patient had a pelvis MRI at [facility] June 2022, recommend gynecology follow-up. 3. Intrahepatic bile duct dilatation focally at the right lobe liver. Recommend follow-up for correlation with preoperative imaging In ED: given 1L bolus and started on IVF. Given ativan, zofran, phenergan and reglan. Surgery consulted and [facility] asked to admit.   Review of  Systems: As per HPI; otherwise review of systems reviewed and negative.   Ambulatory Status: Ambulates without assistance  Past Medical History:  Diagnosis Date   Allergy    Anemia    Anxiety    Asthma    Constipation    Depression    Diverticulitis    Fibroids 2017   left sided uterine leiomyoma    Gallbladder disease    GERD (gastroesophageal reflux disease)    Goiter    Hyperlipidemia    Joint pain    Kidney stone 08/31/2016   incidental find on CT right renal calculus   Lactose intolerance    Thyroid cancer ([place])    Thyroid nodule 03/2013   2.6x1.9x1.8 cm on last US thyroid 02/2015- "stable".  lg mixed schogenic nodule left thyroid. "Follicular lesion of unknown significance"    Past Surgical History:  Procedure Laterality Date   BIOPSY THYROID  06/2020   CHOLECYSTECTOMY  2011   POST OP ITCHING   TRANSAXILLERY ENDOSCOPIC LEFT THYROID LOBECTOMY WITH ROBOTIC ASSISTANCE Left 08/2020   scheduled end of May 2022.nodule- high concern for malignancy    Social History   Socioeconomic History   Marital status: Married    Spouse name: Not on file   Number of children: Not on file   Years of education: Not on file   Highest education level: Not on file  Occupational History   Occupation: Remote Supply chain consultant- [employer]  Tobacco Use   Smoking status: Never   Smokeless tobacco: Never  Vaping Use   Vaping Use: Never used  Substance and Sexual Activity   Alcohol use: Yes    Comment: RARE  Drug use: Never   Sexual activity: Yes    Partners: Male  Other Topics Concern   Not on file  Social History Narrative   Marital status/children/pets: married, 1 child   Education/employment: BA. Building surveyor.    Safety:      -smoke alarm in the home:Yes     - wears seatbelt: Yes     - Feels safe in their relationships: Yes   Social Determinants of Health   Financial Resource Strain: Not on file  Food Insecurity: Not on file  Transportation Needs:  Not on file  Physical Activity: Not on file  Stress: Not on file  Social Connections: Not on file  Intimate Partner Violence: Not on file    Allergies  Allergen Reactions   Codeine Itching    Family History  Problem Relation Age of Onset   Early death Mother    Kidney disease Mother    Alcohol abuse Father    Cancer Father        oral HPV   Alcoholism Father    Obesity Father    Colitis Father    Colon polyps Father    Diabetes Maternal Grandmother    Alcohol abuse Maternal Grandfather    Heart disease Maternal Grandfather    Heart attack Paternal Grandmother    Heart attack Paternal Grandfather    Colon cancer Neg Hx    Esophageal cancer Neg Hx    Stomach cancer Neg Hx     Prior to Admission medications   Medication Sig Start Date End Date Taking? Authorizing Provider  buPROPion (WELLBUTRIN XL) 300 MG 24 hr tablet Take 1 tablet (300 mg total) by mouth daily. 02/15/21  Yes Kuneff, Renee A, DO  levothyroxine (SYNTHROID) 50 MCG tablet Take 1 tablet (50 mcg total) by mouth daily. Take on an empty stomach with a glass of water at least 30-60 minutes before breakfast. 01/05/21  Yes   LORazepam (ATIVAN) 0.5 MG tablet Take 1 tablet (0.5 mg total) by mouth daily as needed for anxiety. 02/15/21  Yes Kuneff, Renee A, DO  meloxicam (MOBIC) 15 MG tablet Take 1 tablet (15 mg total) by mouth daily. 03/21/21  Yes   Multiple Vitamin (MULTIVITAMIN) tablet Take 1 tablet by mouth daily.   Yes [provider]  polyethylene glycol (MIRALAX / GLYCOLAX) 17 g packet Take 17 g by mouth daily.   Yes [provider]  tirzepatide Darcel Bayley) 2.5 MG/0.5ML Pen Inject 2.5 mg into the skin once a week. 04/13/21  Yes Beasley, Caren D, MD  Vitamin D, Ergocalciferol, (DRISDOL) 1.25 MG (50000 UNIT) CAPS capsule Take 1 capsule (50,000 Units total) by mouth every 7 (seven) days. 04/13/21  Yes Beasley, Caren D, MD  dicyclomine (BENTYL) 10 MG capsule Take 2 capsules (20 mg total) by mouth 3 (three)  times daily as needed for spasms. 11/25/20   PyrtleLajuan Lines, MD    Physical Exam: Vitals:   05/04/21 1130 05/04/21 1430 05/04/21 1537 05/04/21 1729  BP: 123/67 113/69 122/74 130/64  Pulse: 84 95 78 87  Resp: 17 17 16 18   Temp:    98.4 F (36.9 C)  TempSrc:    Oral  SpO2: 94% 94% 96% 100%  Weight:      Height:         General:  Appears calm and comfortable and is in NAD Eyes:  PERRL, EOMI, normal lids, iris ENT:  grossly normal hearing, lips & tongue, mmm; appropriate dentition Neck:  no LAD, masses  or thyromegaly; no carotid bruits Cardiovascular:  RRR, no m/r/g. No LE edema.  Respiratory:   CTA bilaterally with no wheezes/rales/rhonchi.  Normal respiratory effort. Abdomen:  soft, mild TTP in epigastric area, ND, absent bowel sounds  Back:   normal alignment, no CVAT Skin:  no rash or induration seen on limited exam Musculoskeletal:  grossly normal tone BUE/BLE, good ROM, no bony abnormality Lower extremity:  No LE edema.  Limited foot exam with no ulcerations.  2+ distal pulses. Psychiatric:  grossly normal mood and affect, speech fluent and appropriate, AOx3 Neurologic:  CN 2-12 grossly intact, moves all extremities in coordinated fashion, sensation intact    Radiological Exams on Admission: Independently reviewed - see discussion in A/P where applicable  CT ABDOMEN PELVIS W CONTRAST  Result Date: 05/04/2021 CLINICAL DATA:  Chest pain that started after several episodes of nausea and vomiting last night. EXAM: CT ABDOMEN AND PELVIS WITH CONTRAST TECHNIQUE: Multidetector CT imaging of the abdomen and pelvis was performed using the standard protocol following bolus administration of intravenous contrast. RADIATION DOSE REDUCTION: This exam was performed according to the departmental dose-optimization program which includes automated exposure control, adjustment of the mA and/or kV according to patient size and/or use of iterative reconstruction technique. CONTRAST:  27mL  OMNIPAQUE IOHEXOL 300 MG/ML  SOLN COMPARISON:  None. FINDINGS: Lower chest:  No contributory findings. Hepatobiliary: No focal liver abnormality.Cholecystectomy. Dilated intrahepatic ducts in the inferior right lobe liver without visible underlying stone or enhancing lesion. Pancreas: Unremarkable. Spleen: Unremarkable. Adrenals/Urinary Tract: Negative adrenals. No hydronephrosis or stone. Unremarkable bladder. Stomach/Bowel: Fluid-filled stomach and proximal small bowel with relative transition in the left upper quadrant where there is no focal abnormality. Mild left colonic diverticulosis. Vascular/Lymphatic: No acute vascular abnormality. No mass or adenopathy. Reproductive:Enlarged uterus. 4.4 cm intramural mass in the left body consistent with fibroid. Enhancing or otherwise high-density material in the endometrial cavity which is unexpectedly thickened for age at 14 mm. Other: No ascites or pneumoperitoneum. Musculoskeletal: No acute abnormalities. Lumbar facet degeneration with mild L2-3 anterolisthesis. IMPRESSION: 1. Partial small bowel obstruction with transition in the left upper quadrant. 2. Thickened endometrium (14 mm) for postmenopausal age with either high-density or enhancing material. The patient had a pelvis MRI at [facility] June 2022, recommend gynecology follow-up. 3. Intrahepatic bile duct dilatation focally at the right lobe liver. Recommend follow-up for correlation with preoperative imaging (cholecystectomy). Electronically Signed   By: Jorje Guild M.D.   On: 05/04/2021 08:02    EKG: Independently reviewed.  NSR with rate 87; nonspecific ST changes with no evidence of acute ischemia   Labs on Admission: I have personally reviewed the available labs and imaging studies at the time of the admission.  Pertinent labs:  mcv: 75.9,  platelets 442,     Assessment/Plan * SBO (small bowel obstruction) ([place])- (present on admission) 51 year old female presenting with abdominal pain and  intractable vomiting found to have SBO -Admit to MedSurg and make n.p.o. -no clinical findings for emergent surgery at this time  -Gentle IV fluid hydration -Pain medication with morphine 1mg  q3 hours prn, she is currently pain free  -encourage ambulation -Monitor electrolytes -General surgery consulted and has seen patient. Manage conservatively, okay for ice chips.  -She has not been vomiting while here so no NG tube placed at this time, if any vomiting, will need NG tube.    Thrombocytosis Likely reactive, continue to monitor   Anxiety- (present on admission) Will do IV ativan prn while NPO  Esophageal stenosis S/p EGD on 03/23/21 with intrinsic moderate stenosis of esophagus, s/p balloon dilation, gastric polyps. No reported gastric ulcers.  Followed by Dr. Hilarie Fredrickson   Thickened endometrium with fibroids  Followed by gynecology   Thyroid cancer [facility]) s/p thyroidectomy- (present on admission) Followed outpatient by endocrinology at [place] when tolerating PO       Mar will need to be completed when no longer NPO    Body mass index is 27.99 kg/m.   Level of care: Med-Surg DVT prophylaxis:  SCDs/early ambulation  Code Status:  Full - confirmed with patient/family Family Communication: husband at bedside: [facility]. Disposition Plan:  The patient is from: home  Anticipated d/c is to: home   Requires inpatient hospitalization and is at significant risk of  worsening, requires constant monitoring, IVF, frequent assessment and MDM with specialists.    Patient is currently: stable  Consults called: general surgery: Dr. Windle Guard   Admission status:  inpatient    Orma Flaming MD [facility]   How to contact the [facility] Attending or Consulting provider [place] or covering provider during after hours [place], for this patient?  Check the care team in [facility] and look for a) attending/consulting [facility] provider listed and b) the [facility] team listed Log into  [email or web address] and use [place]'s universal password to access. If you do not have the password, please contact the hospital operator. Locate the [facility] provider you are looking for under [facility] and page to a number that you can be directly reached. If you still have difficulty reaching the provider, please page the [facility] (Director on Call) for the Hospitalists listed on amion for assistance.   05/04/2021, 7:35 PM

## 2021-05-04 NOTE — Assessment & Plan Note (Addendum)
S/p EGD on 03/23/21 with intrinsic moderate stenosis of esophagus, s/p balloon dilation, gastric polyps. No reported gastric ulcers.  Followed by Dr. Hilarie Fredrickson

## 2021-05-04 NOTE — Progress Notes (Signed)
Plan of Care Note for accepted transfer   Patient: Patricia Tran MRN: [id number]   DOA: 05/04/2021  Facility requesting transfer: [facility] Requesting Provider: Maryan Rued Reason for transfer: SBO Facility course: Patient with h/o HLD and thyroid cancer, s/p cholecystectomy presenting with abdominal pain.  Found to have partial SBO, no prior history of the same.  Persistent vomiting.  Relative transition point on imaging.  Patient requests transfer to [facility] (husband is Mudlogger of cath lab).  Surgery will consult - needs to be notified upon admission.  May need NG tube.  On IVF.   Plan of care: The patient is accepted for admission to [place]  unit, at [facility]..    Author: Karmen Bongo, MD 05/04/2021  Check [email or web address] for on-call coverage.  Nursing staff, Please call [place] number on Amion as soon as patient's arrival, so appropriate admitting provider can evaluate the pt.

## 2021-05-04 NOTE — Assessment & Plan Note (Signed)
Followed outpatient by endocrinology at [facility] when tolerating PO

## 2021-05-04 NOTE — Plan of Care (Signed)
  Problem: Elimination: Goal: Will not experience complications related to urinary retention Outcome: Progressing   Problem: Pain Managment: Goal: General experience of comfort will improve Outcome: Progressing   

## 2021-05-04 NOTE — ED Notes (Signed)
Pt resting comfortably at this time.  Husband at bedside.

## 2021-05-04 NOTE — Assessment & Plan Note (Signed)
Followed by gynecology

## 2021-05-04 NOTE — Consult Note (Signed)
Surgical Evaluation Requesting provider: Dr. Orma Flaming  Chief Complaint: emesis  HPI: 51 year old woman who presented to [facility] this morning just before 6 AM with a chief complaint of nausea and vomiting began around 6 PM yesterday.  Denies changes in bowel movements, no known sick contacts, no fever.  Has not had a bowel movement or flatus since this began.  Endorsed some epigastric discomfort and tightness, no relief with Zofran or warm bath.  Noted that if she laid on her right side the nausea became violently worse, but had some relief lying on her left side.  Would have some transient relief after each episode of emesis, but anytime she tried to take sips of water she would vomit this plus large volume.  Had minimal relief with Phenergan at the ER, but did get some Ativan which helped.  Underwent CT scan demonstrating partial small bowel obstruction and has subsequently been transferred to [facility] where she arrived a short while ago.  On evaluation this evening, she says she feels "night and day" better. No more abdominal discomfort currently, and reports bloating is better. Has not vomited since early afternoon and not currently nauseated. Her only previous abdominal surgery is a lap chole Of note, she is followed by Dr. Leafy Ro and is on [place].  She has been on this since August, her main side effect is constipation, has not had nausea or vomiting as a side effect and is still on 2.5 mg.  Her husband Cecilie Lowers is at the bedside, he is Clinical biochemist of our cath lab.   Allergies  Allergen Reactions   Codeine Itching    Past Medical History:  Diagnosis Date   Allergy    Anemia    Anxiety    Asthma    Constipation    Depression    Diverticulitis    Fibroids 2017   left sided uterine leiomyoma    Gallbladder disease    GERD (gastroesophageal reflux disease)    Goiter    Hyperlipidemia    Joint pain    Kidney stone 08/31/2016   incidental find on CT right renal calculus    Lactose intolerance    Thyroid cancer ([place])    Thyroid nodule 03/2013   2.6x1.9x1.8 cm on last US thyroid 02/2015- "stable".  lg mixed schogenic nodule left thyroid. "Follicular lesion of unknown significance"    Past Surgical History:  Procedure Laterality Date   BIOPSY THYROID  06/2020   CHOLECYSTECTOMY  2011   POST OP ITCHING   TRANSAXILLERY ENDOSCOPIC LEFT THYROID LOBECTOMY WITH ROBOTIC ASSISTANCE Left 08/2020   scheduled end of May 2022.nodule- high concern for malignancy    Family History  Problem Relation Age of Onset   Early death Mother    Kidney disease Mother    Alcohol abuse Father    Cancer Father        oral HPV   Alcoholism Father    Obesity Father    Colitis Father    Colon polyps Father    Diabetes Maternal Grandmother    Alcohol abuse Maternal Grandfather    Heart disease Maternal Grandfather    Heart attack Paternal Grandmother    Heart attack Paternal Grandfather    Colon cancer Neg Hx    Esophageal cancer Neg Hx    Stomach cancer Neg Hx     Social History   Socioeconomic History   Marital status: Married    Spouse name: Not on file   Number of children: Not on  file   Years of education: Not on file   Highest education level: Not on file  Occupational History   Occupation: Remote Supply chain consultant- [employer]  Tobacco Use   Smoking status: Never   Smokeless tobacco: Never  Vaping Use   Vaping Use: Never used  Substance and Sexual Activity   Alcohol use: Yes    Comment: RARE   Drug use: Never   Sexual activity: Yes    Partners: Male  Other Topics Concern   Not on file  Social History Narrative   Marital status/children/pets: married, 1 child   Education/employment: BA. Building surveyor.    Safety:      -smoke alarm in the home:Yes     - wears seatbelt: Yes     - Feels safe in their relationships: Yes   Social Determinants of Health   Financial Resource Strain: Not on file  Food Insecurity: Not on file   Transportation Needs: Not on file  Physical Activity: Not on file  Stress: Not on file  Social Connections: Not on file    No current facility-administered medications on file prior to encounter.   Current Outpatient Medications on File Prior to Encounter  Medication Sig Dispense Refill   buPROPion (WELLBUTRIN XL) 300 MG 24 hr tablet Take 1 tablet (300 mg total) by mouth daily. 90 tablet 1   levothyroxine (SYNTHROID) 50 MCG tablet Take 1 tablet (50 mcg total) by mouth daily. Take on an empty stomach with a glass of water at least 30-60 minutes before breakfast. 30 tablet 11   LORazepam (ATIVAN) 0.5 MG tablet Take 1 tablet (0.5 mg total) by mouth daily as needed for anxiety. 90 tablet 1   meloxicam (MOBIC) 15 MG tablet Take 1 tablet (15 mg total) by mouth daily. 30 tablet 0   Multiple Vitamin (MULTIVITAMIN) tablet Take 1 tablet by mouth daily.     polyethylene glycol (MIRALAX / GLYCOLAX) 17 g packet Take 17 g by mouth daily.     tirzepatide [facility]) 2.5 MG/0.5ML Pen Inject 2.5 mg into the skin once a week. 2 mL 0   Vitamin D, Ergocalciferol, (DRISDOL) 1.25 MG (50000 UNIT) CAPS capsule Take 1 capsule (50,000 Units total) by mouth every 7 (seven) days. 4 capsule 0   dicyclomine (BENTYL) 10 MG capsule Take 2 capsules (20 mg total) by mouth 3 (three) times daily as needed for spasms. 60 capsule 1    Review of Systems: a complete, 10pt review of systems was completed with pertinent positives and negatives as documented in the HPI  Physical Exam: Vitals:   05/04/21 1537 05/04/21 1729  BP: 122/74 130/64  Pulse: 78 87  Resp: 16 18  Temp:  98.4 F (36.9 C)  SpO2: 96% 100%   Gen: A&Ox3, no distress  Chest: respiratory effort is normal.   Gastrointestinal: soft, minimally disteded, nontender. No mass, hepatomegaly or splenomegaly. No hernia. Lymphatic: no lymphadenopathy in the neck or groin Muscoloskeletal: no clubbing or cyanosis of the fingers.  Neuro: Grossly normal Psych:  appropriate mood and affect, normal insight/judgment intact  Skin: warm and dry   CBC Latest Ref Rng & Units 05/04/2021 08/11/2020 10/01/2018  WBC 4.0 - 10.5 K/uL 8.2 6.6 6.1  Hemoglobin 12.0 - 15.0 g/dL 12.1 12.6 12.8  Hematocrit 36.0 - 46.0 % 37.7 39.9 40  Platelets 150 - 400 K/uL 442(H) - 331    CMP Latest Ref Rng & Units 05/04/2021 12/01/2020 08/11/2020  Glucose 70 - 99 mg/dL 108(H) 78 80  BUN 6 - 20 mg/dL 15 12 12   Creatinine 0.44 - 1.00 mg/dL 0.99 0.81 0.82  Sodium 135 - 145 mmol/L 139 140 142  Potassium 3.5 - 5.1 mmol/L 4.5 4.6 4.4  Chloride 98 - 111 mmol/L 102 105 105  CO2 22 - 32 mmol/L 24 22 22   Calcium 8.9 - 10.3 mg/dL 9.9 8.8 8.8  Total Protein 6.5 - 8.1 g/dL 8.0 6.9 6.7  Total Bilirubin 0.3 - 1.2 mg/dL 0.5 0.3 0.3  Alkaline Phos 38 - 126 U/L 120 102 89  AST 15 - 41 U/L 32 20 19  ALT 0 - 44 U/L 37 24 30    No results found for: INR, PROTIME  Imaging: CT ABDOMEN PELVIS W CONTRAST  Result Date: 05/04/2021 CLINICAL DATA:  Chest pain that started after several episodes of nausea and vomiting last night. EXAM: CT ABDOMEN AND PELVIS WITH CONTRAST TECHNIQUE: Multidetector CT imaging of the abdomen and pelvis was performed using the standard protocol following bolus administration of intravenous contrast. RADIATION DOSE REDUCTION: This exam was performed according to the departmental dose-optimization program which includes automated exposure control, adjustment of the mA and/or kV according to patient size and/or use of iterative reconstruction technique. CONTRAST:  69mL OMNIPAQUE IOHEXOL 300 MG/ML  SOLN COMPARISON:  None. FINDINGS: Lower chest:  No contributory findings. Hepatobiliary: No focal liver abnormality.Cholecystectomy. Dilated intrahepatic ducts in the inferior right lobe liver without visible underlying stone or enhancing lesion. Pancreas: Unremarkable. Spleen: Unremarkable. Adrenals/Urinary Tract: Negative adrenals. No hydronephrosis or stone. Unremarkable bladder.  Stomach/Bowel: Fluid-filled stomach and proximal small bowel with relative transition in the left upper quadrant where there is no focal abnormality. Mild left colonic diverticulosis. Vascular/Lymphatic: No acute vascular abnormality. No mass or adenopathy. Reproductive:Enlarged uterus. 4.4 cm intramural mass in the left body consistent with fibroid. Enhancing or otherwise high-density material in the endometrial cavity which is unexpectedly thickened for age at 14 mm. Other: No ascites or pneumoperitoneum. Musculoskeletal: No acute abnormalities. Lumbar facet degeneration with mild L2-3 anterolisthesis. IMPRESSION: 1. Partial small bowel obstruction with transition in the left upper quadrant. 2. Thickened endometrium (14 mm) for postmenopausal age with either high-density or enhancing material. The patient had a pelvis MRI at [facility] June 2022, recommend gynecology follow-up. 3. Intrahepatic bile duct dilatation focally at the right lobe liver. Recommend follow-up for correlation with preoperative imaging (cholecystectomy). Electronically Signed   By: Jorje Guild M.D.   On: 05/04/2021 08:02     A/P: 51 year old woman with possible partial small bowel obstruction versus viral gastroenteritis.  Fortunately her symptoms are already improving.  We will hold off on NG tube now.  Patient advised to rest this evening, continue bowel rest and IV fluids, nausea medications if needed, if develops significant nausea or recurrent vomiting this evening would recommend NG tube.  We will continue to follow.    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

## 2021-05-04 NOTE — Assessment & Plan Note (Signed)
Likely reactive, continue to monitor

## 2021-05-04 NOTE — ED Notes (Signed)
Patient transported to CT 

## 2021-05-04 NOTE — Assessment & Plan Note (Signed)
Will do IV ativan prn while NPO

## 2021-05-04 NOTE — ED Notes (Signed)
Pt stood at side of bed to wash her bottom and get her sheets changed , putting on fresh gown and mesh panties , pt aaox4, husband at bedside

## 2021-05-04 NOTE — ED Notes (Signed)
Husband back pt vomited again , ~ 800cc dr Maryan Rued aware pt ordered more phenergan

## 2021-05-04 NOTE — ED Provider Notes (Signed)
I assumed care from Dr. Dina Rich at 7 AM.  Patient with persistent vomiting and prior history of cholecystectomy.  Minimal abdominal pain on exam but persistent nausea.  I independently reviewed the CT and appears that patient has a partial small bowel obstruction.  Radiology reports relative transition point in the left upper quadrant.  On repeat evaluation patient is still nauseated.  Discussed admission with the patient and her husband and high likelihood of return if she is discharged home.  She desires to go to [facility] as her husband works there.  Will be started on IV fluids.  We will continue to treat with antiemetics.  At this time we will hold on NG tube.  Findings discussed with the patient and her husband.  Questions were answered.  Spoke with Dr. Donne Hazel who reported the surgical service will be happy to consult on the patient once they arrive at the hospital.   Blanchie Dessert, MD 05/04/21 [phone]

## 2021-05-04 NOTE — ED Provider Notes (Signed)
[place] EMERGENCY DEPT Provider Note   CSN: [id number] Arrival date & time: 05/04/21  0551     History  Chief Complaint  Patient presents with   Abdominal Pain   Chest Pain    Rebekkah Powless is a 51 y.o. female.  HPI     This is a 51 year old female who presents with nausea and vomiting.  Patient has a history of hiatal hernia.  Reports that she has had nonbilious, nonbloody emesis since 6 PM yesterday.  She reports multiple episodes of emesis.  No changes in bowel movements.  No diarrhea.  No known sick contacts.  Reports some epigastric discomfort.  She took Zofran with minimal relief.  Has not had any fevers.  Denies chest pain or shortness of breath.  Previous cholecystectomy.  Patient denies frequent NSAID use.  Does occasionally take Mobic.  Denies alcohol or drug use.  Home Medications Prior to Admission medications   Medication Sig Start Date End Date Taking? Authorizing Provider  buPROPion (WELLBUTRIN XL) 300 MG 24 hr tablet Take 1 tablet (300 mg total) by mouth daily. 02/15/21   Kuneff, Renee A, DO  dicyclomine (BENTYL) 10 MG capsule Take 2 capsules (20 mg total) by mouth 3 (three) times daily as needed for spasms. 11/25/20   Pyrtle, Lajuan Lines, MD  levothyroxine (SYNTHROID) 50 MCG tablet Take 1 tablet (50 mcg total) by mouth daily. Take on an empty stomach with a glass of water at least 30-60 minutes before breakfast. 01/05/21     LORazepam (ATIVAN) 0.5 MG tablet Take 1 tablet (0.5 mg total) by mouth daily as needed for anxiety. 02/15/21   Kuneff, Renee A, DO  meloxicam (MOBIC) 15 MG tablet Take 1 tablet (15 mg total) by mouth daily. 03/21/21     Multiple Vitamin (MULTIVITAMIN) tablet Take 1 tablet by mouth daily.    [provider]  polyethylene glycol (MIRALAX / GLYCOLAX) 17 g packet Take 17 g by mouth daily.    [provider]  tirzepatide Darcel Bayley) 2.5 MG/0.5ML Pen Inject 2.5 mg into the skin once a week. 04/13/21   Dennard Nip D,  MD  Vitamin D, Ergocalciferol, (DRISDOL) 1.25 MG (50000 UNIT) CAPS capsule Take 1 capsule (50,000 Units total) by mouth every 7 (seven) days. 04/13/21   Dennard Nip D, MD      Allergies    Codeine    Review of Systems   Review of Systems  Constitutional:  Negative for fever.  Respiratory:  Negative for shortness of breath.   Cardiovascular:  Negative for chest pain.  Gastrointestinal:  Positive for abdominal pain, nausea and vomiting. Negative for constipation and diarrhea.  All other systems reviewed and are negative.  Physical Exam Updated Vital Signs BP 122/85 (BP Location: Left Arm)    Pulse (!) 113    Temp (!) 97.5 F (36.4 C)    Resp 17    Ht 1.6 m (5\' 3" )    Wt 71.7 kg    LMP 04/29/2021 (Exact Date)    SpO2 99%    BMI 27.99 kg/m  Physical Exam Vitals and nursing note reviewed.  Constitutional:      Appearance: She is well-developed. She is obese. She is not ill-appearing.  HENT:     Head: Normocephalic and atraumatic.  Eyes:     Pupils: Pupils are equal, round, and reactive to light.  Cardiovascular:     Rate and Rhythm: Regular rhythm. Tachycardia present.     Heart sounds: Normal heart sounds.  Pulmonary:  Effort: Pulmonary effort is normal. No respiratory distress.     Breath sounds: No wheezing.  Abdominal:     General: Bowel sounds are normal.     Palpations: Abdomen is soft.     Tenderness: There is no abdominal tenderness. There is no guarding or rebound.  Musculoskeletal:     Cervical back: Neck supple.  Skin:    General: Skin is warm and dry.  Neurological:     Mental Status: She is alert and oriented to person, place, and time.  Psychiatric:        Mood and Affect: Mood normal.    ED Results / Procedures / Treatments   Labs (all labs ordered are listed, but only abnormal results are displayed) Labs Reviewed  CBC WITH DIFFERENTIAL/PLATELET - Abnormal; Notable for the following components:      Result Value   MCV 75.9 (*)    MCH 24.3 (*)     RDW 17.1 (*)    Platelets 442 (*)    All other components within normal limits  COMPREHENSIVE METABOLIC PANEL  LIPASE, BLOOD  URINALYSIS, ROUTINE W REFLEX MICROSCOPIC  PREGNANCY, URINE  TROPONIN I (HIGH SENSITIVITY)    EKG EKG Interpretation  Date/Time:  Tuesday May 04 2021 06:10:27 EST Ventricular Rate:  87 PR Interval:  134 QRS Duration: 65 QT Interval:  380 QTC Calculation: 458 R Axis:   46 Text Interpretation: Sinus rhythm Low voltage, precordial leads Confirmed by Thayer Jew [phone]) on 05/04/2021 6:51:06 AM  Radiology No results found.  Procedures Procedures    Medications Ordered in ED Medications  sodium chloride 0.9 % bolus 1,000 mL (1,000 mLs Intravenous New Bag/Given 05/04/21 5643)  ondansetron (ZOFRAN) injection 4 mg (4 mg Intravenous Given 05/04/21 0630)    ED Course/ Medical Decision Making/ A&P                           Medical Decision Making Amount and/or Complexity of Data Reviewed Labs: ordered. Radiology: ordered.  Risk Prescription drug management. Decision regarding hospitalization.   This patient presents to the ED for concern of nausea and vomiting, this involves an extensive number of treatment options, and is a complaint that carries with it a high risk of complications and morbidity.  The differential diagnosis includes viral gastroenteritis, gastritis, SBO  MDM:    This is a 51 year old female who presents with nausea and vomiting.  She is largely nontoxic and vital signs are reassuring.  She is afebrile.  Abdomen is fairly benign but she is actively vomiting.  She was given fluids and nausea medication.  Labs obtained.  No significant leukocytosis.  EKG shows normal sinus rhythm without arrhythmia or ischemia.  Troponin negative.  COVID and influenza testing negative.  LFTs and lipase reassuring.  On recheck, she continues to have significant nausea and vomiting.  While her abdominal exam is fairly benign, she does have increased  bowel sounds and has a history of cholecystectomy which would put her at increased risk for SBO.  Will obtain imaging.  Patient signed out to oncoming provider. (Labs, imaging)  Labs: I Ordered, and personally interpreted labs.  The pertinent results include: Normal white count, lipase, LFTs  Imaging Studies ordered: I ordered imaging studies including CT abdomen pending I independently visualized and interpreted imaging. I agree with the radiologist interpretation  Additional history obtained from husband.  External records from outside source obtained and reviewed including prior records  Critical Interventions: IV fluids and  nausea medication  Consultations: I requested consultation with the NA,  and discussed lab and imaging findings as well as pertinent plan - they recommend: N/A  Cardiac Monitoring: The patient was maintained on a cardiac monitor.  I personally viewed and interpreted the cardiac monitored which showed an underlying rhythm of: Normal sinus rhythm  Reevaluation: After the interventions noted above, I reevaluated the patient and found that they have :stayed the same   Considered admission for: Intractable vomiting  Social Determinants of Health: Lives independently  Disposition: Pending  Co morbidities that complicate the patient evaluation  Past Medical History:  Diagnosis Date   Allergy    Anemia    Anxiety    Asthma    Constipation    Depression    Diverticulitis    Fibroids 2017   left sided uterine leiomyoma    Gallbladder disease    GERD (gastroesophageal reflux disease)    Goiter    Hyperlipidemia    Joint pain    Kidney stone 08/31/2016   incidental find on CT right renal calculus   Lactose intolerance    Thyroid cancer ([place])    Thyroid nodule 03/2013   2.6x1.9x1.8 cm on last US thyroid 02/2015- "stable".  lg mixed schogenic nodule left thyroid. "Follicular lesion of unknown significance"     Medicines Meds ordered this encounter   Medications   sodium chloride 0.9 % bolus 1,000 mL   ondansetron (ZOFRAN) injection 4 mg   DISCONTD: promethazine (PHENERGAN) 25 mg in sodium chloride 0.9 % 50 mL IVPB   iohexol (OMNIPAQUE) 300 MG/ML solution 80 mL   promethazine (PHENERGAN) 25 MG/ML injection    Amedeo Gory N: cabinet override   lactated ringers infusion   metoCLOPramide (REGLAN) injection 10 mg   LORazepam (ATIVAN) injection 1 mg   DISCONTD: promethazine (PHENERGAN) 25 mg in sodium chloride 0.9 % 50 mL IVPB   promethazine (PHENERGAN) 25 MG/ML injection    Roughgarden, Cynthia: cabinet override   morphine 2 MG/ML injection 1 mg   promethazine (PHENERGAN) 12.5 mg in sodium chloride 0.9 % 50 mL IVPB   LORazepam (ATIVAN) injection 0.5 mg   ondansetron (ZOFRAN) injection 4 mg    I have reviewed the patients home medicines and have made adjustments as needed  Problem List / ED Course: Problem List Items Addressed This Visit       Digestive   * (Principal) SBO (small bowel obstruction) ([place])    51 year old female presenting with abdominal pain and intractable vomiting found to have SBO -Admit to MedSurg and make n.p.o. -no clinical findings for emergent surgery at this time  -Gentle IV fluid hydration -Pain medication with morphine 1mg  q3 hours prn, she is currently pain free  -encourage ambulation -Monitor electrolytes -General surgery consulted and has seen patient. Manage conservatively, okay for ice chips.  -She has not been vomiting while here so no NG tube placed at this time, if any vomiting, will need NG tube.        Relevant Orders   DG Abd Portable 1V   Other Visit Diagnoses     Partial small bowel obstruction ([place])    -  Primary                   Final Clinical Impression(s) / ED Diagnoses Final diagnoses:  None    Rx / DC Orders ED Discharge Orders     None         Christepher Melchior, Barbette Hair, MD 05/05/21  0538 ° °

## 2021-05-04 NOTE — ED Triage Notes (Signed)
Chest pain started after several episodes of nausea and vomiting that started around 6pm last evening.

## 2021-05-05 ENCOUNTER — Inpatient Hospital Stay (HOSPITAL_COMMUNITY): Payer: 59

## 2021-05-05 DIAGNOSIS — K56609 Unspecified intestinal obstruction, unspecified as to partial versus complete obstruction: Secondary | ICD-10-CM | POA: Diagnosis not present

## 2021-05-05 DIAGNOSIS — R933 Abnormal findings on diagnostic imaging of other parts of digestive tract: Secondary | ICD-10-CM | POA: Diagnosis not present

## 2021-05-05 DIAGNOSIS — D509 Iron deficiency anemia, unspecified: Secondary | ICD-10-CM | POA: Diagnosis not present

## 2021-05-05 LAB — CBC
HCT: 30.1 % — ABNORMAL LOW (ref 36.0–46.0)
HCT: 30 % — ABNORMAL LOW (ref 36.0–46.0)
Hemoglobin: 9.3 g/dL — ABNORMAL LOW (ref 12.0–15.0)
Hemoglobin: 9.4 g/dL — ABNORMAL LOW (ref 12.0–15.0)
MCH: 24.3 pg — ABNORMAL LOW (ref 26.0–34.0)
MCH: 24.7 pg — ABNORMAL LOW (ref 26.0–34.0)
MCHC: 30.9 g/dL (ref 30.0–36.0)
MCHC: 31.3 g/dL (ref 30.0–36.0)
MCV: 78.6 fL — ABNORMAL LOW (ref 80.0–100.0)
MCV: 78.7 fL — ABNORMAL LOW (ref 80.0–100.0)
Platelets: 321 10*3/uL (ref 150–400)
Platelets: 282 10*3/uL (ref 150–400)
RBC: 3.83 MIL/uL — ABNORMAL LOW (ref 3.87–5.11)
RBC: 3.81 MIL/uL — ABNORMAL LOW (ref 3.87–5.11)
RDW: 17.2 % — ABNORMAL HIGH (ref 11.5–15.5)
RDW: 16.9 % — ABNORMAL HIGH (ref 11.5–15.5)
WBC: 7.5 10*3/uL (ref 4.0–10.5)
WBC: 5.1 10*3/uL (ref 4.0–10.5)
nRBC: 0 % (ref 0.0–0.2)
nRBC: 0 % (ref 0.0–0.2)

## 2021-05-05 LAB — BASIC METABOLIC PANEL
Anion gap: 8 (ref 5–15)
BUN: 13 mg/dL (ref 6–20)
CO2: 20 mmol/L — ABNORMAL LOW (ref 22–32)
Calcium: 8 mg/dL — ABNORMAL LOW (ref 8.9–10.3)
Chloride: 107 mmol/L (ref 98–111)
Creatinine, Ser: 0.87 mg/dL (ref 0.44–1.00)
GFR, Estimated: >60 mL/min (ref >60)
Glucose, Bld: 77 mg/dL (ref 70–99)
Potassium: 3.1 mmol/L — ABNORMAL LOW (ref 3.5–5.1)
Sodium: 135 mmol/L (ref 135–145)

## 2021-05-05 LAB — RETICULOCYTES
Immature Retic Fract: 15.2 % (ref 2.3–15.9)
RBC.: 3.81 MIL/uL — ABNORMAL LOW (ref 3.87–5.11)
Retic Count, Absolute: 28.6 10*3/uL (ref 19.0–186.0)
Retic Ct Pct: 0.8 % (ref 0.4–3.1)

## 2021-05-05 LAB — HIV ANTIBODY (ROUTINE TESTING W REFLEX): HIV Screen 4th Generation wRfx: NONREACTIVE

## 2021-05-05 LAB — VITAMIN B12: Vitamin B-12: 513 pg/mL (ref 180–914)

## 2021-05-05 LAB — GLUCOSE, CAPILLARY
Glucose-Capillary: 64 mg/dL — ABNORMAL LOW (ref 70–99)
Glucose-Capillary: 98 mg/dL (ref 70–99)

## 2021-05-05 LAB — FERRITIN: Ferritin: 7 ng/mL — ABNORMAL LOW (ref 11–307)

## 2021-05-05 LAB — IRON AND TIBC
Iron: 52 ug/dL (ref 28–170)
Saturation Ratios: 15 % (ref 10.4–31.8)
TIBC: 349 ug/dL (ref 250–450)
UIBC: 297 ug/dL

## 2021-05-05 LAB — MAGNESIUM: Magnesium: 1.9 mg/dL (ref 1.7–2.4)

## 2021-05-05 LAB — FOLATE: Folate: 21.5 ng/mL (ref >5.9)

## 2021-05-05 MED ORDER — POLYETHYLENE GLYCOL 3350 17 G PO PACK
17.0000 g | PACK | Freq: Every day | ORAL | Status: DC
  Administered 2021-05-05: 17 g via ORAL
  Filled 2021-05-05 (×3): qty 1

## 2021-05-05 MED ORDER — DEXTROSE 50 % IV SOLN
INTRAVENOUS | Status: AC
  Filled 2021-05-05: qty 50

## 2021-05-05 MED ORDER — BISACODYL 10 MG RE SUPP
10.0000 mg | Freq: Once | RECTAL | Status: DC
  Filled 2021-05-05: qty 1

## 2021-05-05 MED ORDER — KCL-LACTATED RINGERS-D5W 20 MEQ/L IV SOLN
INTRAVENOUS | Status: DC
  Filled 2021-05-05 (×3): qty 1000

## 2021-05-05 MED ORDER — LEVOTHYROXINE SODIUM 100 MCG/5ML IV SOLN: 12.5000 ug | Freq: Every day | INTRAVENOUS | Status: DC

## 2021-05-05 MED ORDER — PANTOPRAZOLE SODIUM 40 MG IV SOLR
40.0000 mg | Freq: Two times a day (BID) | INTRAVENOUS | Status: DC
  Administered 2021-05-05: 40 mg via INTRAVENOUS

## 2021-05-05 MED ORDER — POTASSIUM CHLORIDE 10 MEQ/100ML IV SOLN
10.0000 meq | INTRAVENOUS | Status: AC
  Administered 2021-05-05 (×3): 10 meq via INTRAVENOUS
  Filled 2021-05-05 (×4): qty 100

## 2021-05-05 MED ORDER — PANTOPRAZOLE SODIUM 40 MG IV SOLR
40.0000 mg | Freq: Two times a day (BID) | INTRAVENOUS | Status: DC
  Filled 2021-05-05: qty 40

## 2021-05-05 MED ORDER — PANTOPRAZOLE SODIUM 40 MG PO TBEC
40.0000 mg | DELAYED_RELEASE_TABLET | Freq: Every day | ORAL | Status: DC
  Administered 2021-05-06: 40 mg via ORAL
  Filled 2021-05-05: qty 1

## 2021-05-05 MED ORDER — POTASSIUM CHLORIDE 10 MEQ/100ML IV SOLN
10.0000 meq | INTRAVENOUS | Status: AC
  Administered 2021-05-05: 10 meq via INTRAVENOUS
  Filled 2021-05-05: qty 100

## 2021-05-05 MED ORDER — DEXTROSE 50 % IV SOLN
12.5000 g | Freq: Once | INTRAVENOUS | Status: AC
  Administered 2021-05-05: 12.5 g via INTRAVENOUS

## 2021-05-05 NOTE — Consult Note (Addendum)
[facility] Consult: 10:14 AM 05/05/2021  LOS: 1 day    Referring Provider: Frances Nickels  Primary Care Physician:  Ma Hillock, DO Primary Gastroenterologist:  Dr Hilarie Fredrickson   Reason for Consultation:  Anemia, SBO   HPI: Patricia Tran is a 51 y.o. female.  PMH below.  NIDDM.  Papillary thyroid cancer, 09/2020 left thyroid lobectomy.  Subsequent hypothyroidism.  Diverticulitis.  GERD.  Menorrhagia w associated anemia.  Lap chole 2011.  11/2020 colonoscopy.  Initial screening study for polyps/neoplasm.  Sigmoid diverticulosis was the only abnormality. 04/02/2021 EGD.  For pill dysphagia.  Esophagram showed distal esophageal stricture and suggested gastric ulcers.  Dr. Hilarie Fredrickson dilated benign-appearing esophageal stenosis.  There was a 1 to 2 cm HH. Biopsied gastric polyps (path: fundic gland polyps).  Examined duodenum normal.  Symptoms began on Monday.  She ate an orange around noon and about an hour later ate some sliced deli [place].  She had taken Metamucil and MiraLAX a little while before eating the [place].  Around 430 she developed abdominal bloating and epigastric/'s of xiphoid pain.  Pain did not relent with heating pad, hot bath or Toradol.  Within a few hours developed nausea and emesis which started off as clear but progressed through yellow and green bile.  Vomiting refractory to oral Zofran. Presented to the ED at 6 AM the following day, yesterday morning.  Continued to have nonbloody emesis through 2 PM which was her last episode.  Overnight her abdominal pain has resolved and she is tolerating full liquids within the last 90 minutes.  Passing flatus and now has managed to have some watery stool.  Her GI symptoms have essentially resolved.  Surgery involved  Hgb 12.1 on arrival, dropped to 9.4.  Was  12.6 in May 2022.  MCV 76.  Platelets, WBCs normal. FOBT not yet assayed. Potassium 3.1.  Normal sodium, BUN, creatinine, LFTs including albumin. 05/04/2021 CT abdomen pelvis with contrast shows partial SBO with transition at LUQ.  Intrahepatic ductal dilatation focally in right lobe liver.  Previous cholecystectomy.  Left uterine mass consistent with fibroid.  Enhancing or otherwise high density material in the endometrial cavity which is unexpectedly thickened for age. 05/05/2021 portable KUB with nonobstructive bowel gas pattern.  RE Menorrhagia: she goes through at least 10 super absorbant tampons in a 24 hour period, at night uses a diaper to manage bleeding.  Heavy days last ~ 4 to 5 days.  Spotting for a few days before and after.  This month spotting started on the 17th, heavy flow started the 19th thru 23rd, spotting since then.  In the past this bleeding has led to acute anemia which resolves within a few weeks.  She has been prescribed medication for the menorrhagia but decided not to take it.  She met with interventional radiology at [facility] regarding fibroid ablation but has not made a commitment to getting this done, the recovery from this sounded a bit challenging to her.  GI baseline is that of fairly frequent reflux but no dysphagia, no nausea or vomiting.  No anorexia.  Since Dr. Hilarie Fredrickson did not specifically recommend PPI she has not used this and has never been on PPI.  Lives in [place] with her husband.  Does not smoke.  Rarely drinks wine.  Has 1 biologic children and 4 stepchildren all of whom are adults and do not live at home.  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  Past Medical History:  Diagnosis Date   Allergy    Anemia    Anxiety    Asthma    Constipation    Depression    Diverticulitis    Fibroids 2017   left  sided uterine leiomyoma    Gallbladder disease    GERD (gastroesophageal reflux disease)    Goiter    Hyperlipidemia    Joint pain    Kidney stone 08/31/2016   incidental find on CT right renal calculus   Lactose intolerance    Thyroid cancer ([place])    Thyroid nodule 03/2013   2.6x1.9x1.8 cm on last US thyroid 02/2015- "stable".  lg mixed schogenic nodule left thyroid. "Follicular lesion of unknown significance"    Past Surgical History:  Procedure Laterality Date   BIOPSY THYROID  06/2020   CHOLECYSTECTOMY  2011   POST OP ITCHING   TRANSAXILLERY ENDOSCOPIC LEFT THYROID LOBECTOMY WITH ROBOTIC ASSISTANCE Left 08/2020   scheduled end of May 2022.nodule- high concern for malignancy    Prior to Admission medications   Medication Sig Start Date End Date Taking? Authorizing Provider  buPROPion (WELLBUTRIN XL) 300 MG 24 hr tablet Take 1 tablet (300 mg total) by mouth daily. 02/15/21  Yes Kuneff, Renee A, DO  levothyroxine (SYNTHROID) 50 MCG tablet Take 1 tablet (50 mcg total) by mouth daily. Take on an empty stomach with a glass of water at least 30-60 minutes before breakfast. 01/05/21  Yes   LORazepam (ATIVAN) 0.5 MG tablet Take 1 tablet (0.5 mg total) by mouth daily as needed for anxiety. 02/15/21  Yes Kuneff, Renee A, DO  meloxicam (MOBIC) 15 MG tablet Take 1 tablet (15 mg total) by mouth daily. 03/21/21  Yes   Multiple Vitamin (MULTIVITAMIN) tablet Take 1 tablet by mouth daily.   Yes [provider]  polyethylene glycol (MIRALAX / GLYCOLAX) 17 g packet Take 17 g by mouth daily.   Yes [provider]  tirzepatide Darcel Bayley) 2.5 MG/0.5ML Pen Inject 2.5 mg into the skin once a week. 04/13/21  Yes Beasley, Caren D, MD  Vitamin D, Ergocalciferol, (DRISDOL) 1.25 MG (50000 UNIT) CAPS capsule Take 1 capsule (50,000 Units total) by mouth every 7 (seven) days. 04/13/21  Yes Beasley, Caren D, MD  dicyclomine (BENTYL) 10 MG capsule Take 2 capsules (20 mg total) by mouth 3 (three) times  daily as needed for spasms. 11/25/20   Pyrtle, Lajuan Lines, MD    Scheduled Meds:  bisacodyl  10 mg Rectal Once   dextrose       [START ON 05/08/2021] levothyroxine  12.5 mcg Intravenous Daily   pantoprazole (PROTONIX) IV  40 mg Intravenous Q12H   polyethylene glycol  17 g Oral Daily   Infusions:  dextrose 5% lactated ringers with KCl 20 mEq/L 100 mL/hr at 05/05/21 0930   potassium chloride 10 mEq (05/05/21 0901)   promethazine (PHENERGAN) injection (IM or IVPB)     PRN Meds: LORazepam, morphine injection, ondansetron (ZOFRAN) IV,  promethazine (PHENERGAN) injection (IM or IVPB)   Allergies as of 05/04/2021 - Review Complete 05/04/2021  Allergen Reaction Noted   Codeine Itching 05/13/2020    Family History  Problem Relation Age of Onset   Early death Mother    Kidney disease Mother    Alcohol abuse Father    Cancer Father        oral HPV   Alcoholism Father    Obesity Father    Colitis Father    Colon polyps Father    Diabetes Maternal Grandmother    Alcohol abuse Maternal Grandfather    Heart disease Maternal Grandfather    Heart attack Paternal Grandmother    Heart attack Paternal Grandfather    Colon cancer Neg Hx    Esophageal cancer Neg Hx    Stomach cancer Neg Hx     Social History   Socioeconomic History   Marital status: Married    Spouse name: Not on file   Number of children: Not on file   Years of education: Not on file   Highest education level: Not on file  Occupational History   Occupation: Print production planner- [employer]  Tobacco Use   Smoking status: Never   Smokeless tobacco: Never  Vaping Use   Vaping Use: Never used  Substance and Sexual Activity   Alcohol use: Yes    Comment: RARE   Drug use: Never   Sexual activity: Yes    Partners: Male  Other Topics Concern   Not on file  Social History Narrative   Marital status/children/pets: married, 1 child   Education/employment: BA. Building surveyor.    Safety:      -smoke  alarm in the home:Yes     - wears seatbelt: Yes     - Feels safe in their relationships: Yes   Social Determinants of Health   Financial Resource Strain: Not on file  Food Insecurity: Not on file  Transportation Needs: Not on file  Physical Activity: Not on file  Stress: Not on file  Social Connections: Not on file  Intimate Partner Violence: Not on file    REVIEW OF SYSTEMS: Constitutional: No profound weakness or fatigue. ENT:  No nose bleeds Pulm: No shortness of breath or cough CV:  No palpitations, no LE edema.  No angina. GU:  No hematuria, no frequency GYN: See HPI. GI: Per HPI. Heme: Menorrhagia otherwise no excessive bleeding or bruising. Transfusions: None. Neuro:  No headaches, no peripheral tingling or numbness no syncope, no seizures. Derm:  No itching, no rash or sores.  Endocrine:  No sweats or chills.  No polyuria or dysuria Immunization: Viewed. Travel: Not queried.   PHYSICAL EXAM: Vital signs in last 24 hours: Vitals:   05/05/21 0502 05/05/21 0907  BP: 112/69 110/62  Pulse: 84 80  Resp: 18 18  Temp: 98.4 F (36.9 C) (!) 97.4 F (36.3 C)  SpO2: 94% 99%   Wt Readings from Last 3 Encounters:  05/04/21 72.3 kg  04/13/21 72.6 kg  04/02/21 78 kg    General: Very alert, comfortable, well-appearing.  Intelligent able to provide excellent history. Head: No facial asymmetry or swelling.  No signs of head trauma. Eyes: No scleral icterus.  No conjunctival pallor.  EOMI. Ears: No hearing deficit Nose: No congestion or discharge Mouth: Good dentition.  Tongue midline.  Mucosa moist, pink, clear. Neck: No JVD. Lungs: Clear bilaterally with good breath sounds.  No cough or labored breathing Heart: RRR.  No MRG.  S1, S2 present Abdomen: Soft without tenderness.  No HSM, masses, bruits, hernias.   Rectal: Deferred Musc/Skeltl: No joint redness, swelling or gross deformity. Extremities: No CCE. Neurologic: Alert.  No tremors or limb weakness. Skin: No  rash, no sores, no telangiectasia.  Skin changes consistent with previous sun exposure (grew up in [place]) Nodes: No cervical adenopathy Psych: Animated, fluid speech.  Very pleasant and cooperative.  Excellent, detailed historian  Intake/Output from previous day: 01/24 0701 - 01/25 0700 In: 3663.4 [I.V.:2562.1; IV Piggyback:1101.3] Out: -  Intake/Output this shift: Total I/O In: 120 [P.O.:120] Out: -   LAB RESULTS: Recent Labs    05/04/21 0625 05/05/21 0236 05/05/21 0937  WBC 8.2 7.5 5.1  HGB 12.1 9.3* 9.4*  HCT 37.7 30.1* 30.0*  PLT 442* 321 282   BMET Lab Results  Component Value Date   NA 135 05/05/2021   NA 139 05/04/2021   NA 140 12/01/2020   K 3.1 (L) 05/05/2021   K 4.5 05/04/2021   K 4.6 12/01/2020   CL 107 05/05/2021   CL 102 05/04/2021   CL 105 12/01/2020   CO2 20 (L) 05/05/2021   CO2 24 05/04/2021   CO2 22 12/01/2020   GLUCOSE 77 05/05/2021   GLUCOSE 108 (H) 05/04/2021   GLUCOSE 78 12/01/2020   BUN 13 05/05/2021   BUN 15 05/04/2021   BUN 12 12/01/2020   CREATININE 0.87 05/05/2021   CREATININE 0.99 05/04/2021   CREATININE 0.81 12/01/2020   CALCIUM 8.0 (L) 05/05/2021   CALCIUM 9.9 05/04/2021   CALCIUM 8.8 12/01/2020   LFT Recent Labs    05/04/21 0625  PROT 8.0  ALBUMIN 4.2  AST 32  ALT 37  ALKPHOS 120  BILITOT 0.5   PT/INR No results found for: INR, PROTIME Hepatitis Panel No results for input(s): HEPBSAG, HCVAB, HEPAIGM, HEPBIGM in the last 72 hours. C-Diff No components found for: CDIFF Lipase     Component Value Date/Time   LIPASE 16 05/04/2021 0625    Drugs of Abuse  No results found for: LABOPIA, COCAINSCRNUR, LABBENZ, AMPHETMU, THCU, LABBARB   RADIOLOGY STUDIES: CT ABDOMEN PELVIS W CONTRAST  Result Date: 05/04/2021 CLINICAL DATA:  Chest pain that started after several episodes of nausea and vomiting last night. EXAM: CT ABDOMEN AND PELVIS WITH CONTRAST TECHNIQUE: Multidetector CT imaging of the abdomen and pelvis was  performed using the standard protocol following bolus administration of intravenous contrast. RADIATION DOSE REDUCTION: This exam was performed according to the departmental dose-optimization program which includes automated exposure control, adjustment of the mA and/or kV according to patient size and/or use of iterative reconstruction technique. CONTRAST:  71mL OMNIPAQUE IOHEXOL 300 MG/ML  SOLN COMPARISON:  None. FINDINGS: Lower chest:  No contributory findings. Hepatobiliary: No focal liver abnormality.Cholecystectomy. Dilated intrahepatic ducts in the inferior right lobe liver without visible underlying stone or enhancing lesion. Pancreas: Unremarkable. Spleen: Unremarkable. Adrenals/Urinary Tract: Negative adrenals. No hydronephrosis or stone. Unremarkable bladder. Stomach/Bowel: Fluid-filled stomach and proximal small bowel with relative transition in the left upper quadrant where there is no focal abnormality. Mild left colonic diverticulosis. Vascular/Lymphatic: No acute vascular abnormality. No mass or adenopathy. Reproductive:Enlarged uterus. 4.4 cm intramural mass in the left body consistent with fibroid. Enhancing or otherwise high-density material in the endometrial cavity which is unexpectedly thickened for age at 14 mm. Other: No ascites or pneumoperitoneum. Musculoskeletal: No acute abnormalities. Lumbar facet degeneration with mild L2-3 anterolisthesis. IMPRESSION: 1. Partial small bowel obstruction with transition in the left upper quadrant. 2. Thickened  endometrium (14 mm) for postmenopausal age with either high-density or enhancing material. The patient had a pelvis [facility] at [facility] June 2022, recommend gynecology follow-up. 3. Intrahepatic bile duct dilatation focally at the right lobe liver. Recommend follow-up for correlation with preoperative imaging (cholecystectomy). Electronically Signed   By: Jorje Guild M.D.   On: 05/04/2021 08:02   DG Abd Portable 1V  Result Date: 05/05/2021 CLINICAL  DATA:  Small bowel obstruction EXAM: PORTABLE ABDOMEN - 1 VIEW COMPARISON:  None. FINDINGS: No dilated large or small bowel. Small gas in the rectum. Stool in descending colon cholecystectomy clips in the RIGHT upper quadrant. No pathologic calcifications IMPRESSION: Nonobstructive bowel gas pattern. Electronically Signed   By: Suzy Bouchard M.D.   On: 05/05/2021 08:45      IMPRESSION:   SBO. Abnormal for age thickened endometrium with high density material in the endometrial cavity. Follow-up x-ray imaging this morning shows resolution SBO.  Surgery has advanced patient to full liquid diet with plans for soft diet if tolerated.  Microcytic anemia, new. Had fundic gland polyp, dilation of benign distal esophageal stenosis, small hiatal hernia on EGD 1 month ago.  Colonoscopy revealed diverticulosis 6 months ago. Long hx of menaorrhagia related anemia.    Focal dilatation of intrahepatic duct in right liver.  Normal LFTs.      Menorrhagia.  Uterine fibroids.  Abnormal for age thickened endometrium.  Wonder if the high density material in the endometrial cavity may be blood?      GERD.  EGD as above.  No PPI etc at home.  Got IV Protonix this AM    S/p partial thyroidectomy for pappilary thyroid cancer.     PLAN:       MRI liver to eval ductal dilatation.  Tmrw AM after NPO.  Allow continued diet for the remainder of the day., advancing to regular now.     Follow CBC.  Not going to order FOBT test given possibility of false + in setting of menstrual bleeding.      Return to gyn provider for definitive mgt of fibroids, menorrghagia.   .    Change to Protonix 40 mg po daily.       Azucena Freed  05/05/2021, 10:14 AM Phone [phone]

## 2021-05-05 NOTE — Progress Notes (Signed)
°  Transition of Care [facility]) Screening Note   Patient Details  Name: Patricia Tran Date of Birth: 1970/04/25   Transition of Care [facility]) CM/SW Contact:    Tom-Johnson, Renea Ee, RN Phone Number: 05/05/2021, 4:14 PM  Patient is from home with husband and has five sons. Admitted for Small Bowel Obstruction and follow-up x-ray  this morning shows resolution of SBO. Plan is MRI of the liver to evaluate ductal dilatation. Tomorrow morning.  Patient is currently employed as a Sports coach. Independent with care and drives self prior to hospitalization. PCP is Ma Hillock, DO and uses [facility].  Transition of Care Department [facility]) has reviewed patient and no TOC needs have been identified at this time. Patient denies any needs and no PT/OT recommendation noted. We will continue to monitor patient advancement through interdisciplinary progression rounds. If new patient transition needs arise, please place a TOC consult.

## 2021-05-05 NOTE — Progress Notes (Signed)
Subjective: CC: Patient reports around 6pm yesterday all of her symptoms resolved. She denies any abdominal pain or nausea. Last episode of emesis was yesterday around 1pm. She feels her abdomen is no longer distended and now close to her baseline. She is not passing any flatus yet and has not had a bm. She normally goes daily but sometimes her stools are hard after starting Mounjaro. She takes Miralax daily. She had also taken Metamucil on the day of symptom onset.   Objective: Vital signs in last 24 hours: Temp:  [97.4 F (36.3 C)-98.5 F (36.9 C)] 97.4 F (36.3 C) (01/25 0907) Pulse Rate:  [78-95] 80 (01/25 0907) Resp:  [16-18] 18 (01/25 0907) BP: (110-130)/(56-74) 110/62 (01/25 0907) SpO2:  [94 %-100 %] 99 % (01/25 0907) Weight:  [72.3 kg] 72.3 kg (01/24 1600) Last BM Date: 05/01/21  Intake/Output from previous day: 01/24 0701 - 01/25 0700 In: 3663.4 [I.V.:2562.1; IV Piggyback:1101.3] Out: -  Intake/Output this shift: No intake/output data recorded.  PE: Gen:  Alert, NAD, pleasant HEENT: EOM's intact, pupils equal and round Pulm:  rate and effort normal Abd: Soft, ND, NT +BS Psych: A&Ox3  Skin: no rashes noted, warm and dry  Lab Results:  Recent Labs    05/04/21 0625 05/05/21 0236  WBC 8.2 7.5  HGB 12.1 9.3*  HCT 37.7 30.1*  PLT 442* 321   BMET Recent Labs    05/04/21 0625 05/05/21 0236  NA 139 135  K 4.5 3.1*  CL 102 107  CO2 24 20*  GLUCOSE 108* 77  BUN 15 13  CREATININE 0.99 0.87  CALCIUM 9.9 8.0*   PT/INR No results for input(s): LABPROT, INR in the last 72 hours. CMP     Component Value Date/Time   NA 135 05/05/2021 0236   NA 140 12/01/2020 1209   K 3.1 (L) 05/05/2021 0236   CL 107 05/05/2021 0236   CO2 20 (L) 05/05/2021 0236   GLUCOSE 77 05/05/2021 0236   BUN 13 05/05/2021 0236   BUN 12 12/01/2020 1209   CREATININE 0.87 05/05/2021 0236   CALCIUM 8.0 (L) 05/05/2021 0236   PROT 8.0 05/04/2021 0625   PROT 6.9 12/01/2020 1209    ALBUMIN 4.2 05/04/2021 0625   ALBUMIN 3.9 12/01/2020 1209   AST 32 05/04/2021 0625   ALT 37 05/04/2021 0625   ALKPHOS 120 05/04/2021 0625   BILITOT 0.5 05/04/2021 0625   BILITOT 0.3 12/01/2020 1209   GFRNONAA >60 05/05/2021 0236   Lipase     Component Value Date/Time   LIPASE 16 05/04/2021 0625    Studies/Results: CT ABDOMEN PELVIS W CONTRAST  Result Date: 05/04/2021 CLINICAL DATA:  Chest pain that started after several episodes of nausea and vomiting last night. EXAM: CT ABDOMEN AND PELVIS WITH CONTRAST TECHNIQUE: Multidetector CT imaging of the abdomen and pelvis was performed using the standard protocol following bolus administration of intravenous contrast. RADIATION DOSE REDUCTION: This exam was performed according to the departmental dose-optimization program which includes automated exposure control, adjustment of the mA and/or kV according to patient size and/or use of iterative reconstruction technique. CONTRAST:  107mL OMNIPAQUE IOHEXOL 300 MG/ML  SOLN COMPARISON:  None. FINDINGS: Lower chest:  No contributory findings. Hepatobiliary: No focal liver abnormality.Cholecystectomy. Dilated intrahepatic ducts in the inferior right lobe liver without visible underlying stone or enhancing lesion. Pancreas: Unremarkable. Spleen: Unremarkable. Adrenals/Urinary Tract: Negative adrenals. No hydronephrosis or stone. Unremarkable bladder. Stomach/Bowel: Fluid-filled stomach and proximal small bowel with relative transition  in the left upper quadrant where there is no focal abnormality. Mild left colonic diverticulosis. Vascular/Lymphatic: No acute vascular abnormality. No mass or adenopathy. Reproductive:Enlarged uterus. 4.4 cm intramural mass in the left body consistent with fibroid. Enhancing or otherwise high-density material in the endometrial cavity which is unexpectedly thickened for age at 14 mm. Other: No ascites or pneumoperitoneum. Musculoskeletal: No acute abnormalities. Lumbar facet  degeneration with mild L2-3 anterolisthesis. IMPRESSION: 1. Partial small bowel obstruction with transition in the left upper quadrant. 2. Thickened endometrium (14 mm) for postmenopausal age with either high-density or enhancing material. The patient had a pelvis MRI at [facility] June 2022, recommend gynecology follow-up. 3. Intrahepatic bile duct dilatation focally at the right lobe liver. Recommend follow-up for correlation with preoperative imaging (cholecystectomy). Electronically Signed   By: Jorje Guild M.D.   On: 05/04/2021 08:02   DG Abd Portable 1V  Result Date: 05/05/2021 CLINICAL DATA:  Small bowel obstruction EXAM: PORTABLE ABDOMEN - 1 VIEW COMPARISON:  None. FINDINGS: No dilated large or small bowel. Small gas in the rectum. Stool in descending colon cholecystectomy clips in the RIGHT upper quadrant. No pathologic calcifications IMPRESSION: Nonobstructive bowel gas pattern. Electronically Signed   By: Suzy Bouchard M.D.   On: 05/05/2021 08:45    Anti-infectives: Anti-infectives (From admission, onward)    None        Assessment/Plan Psbo - Patient with resolution of symptoms. She is ND/NT on exam. N/v have resolved. Xray this am with non-obstructive bowel gas pattern. Will advance to FLD w/ ADAT order to soft diet. If tolerating soft diet and having bowel function she can be d/c'd later today from our standpoint. Discussed with TRH  FEN - FLD, ADAT to soft diet, miralax, suppository  VTE - SCDs, okay for chemical prophylaxis from a general surgery standpoint ID - None indicated  Patient noted to have thickened endometrium on CT. Recommend outpatient follow up with GYN  Moderate Medical Decision Making    LOS: 1 day    Jillyn Ledger , [facility] Surgery 05/05/2021, 9:28 AM Please see Amion for pager number during day hours 7:00am-4:30pm

## 2021-05-05 NOTE — Plan of Care (Signed)
  Problem: Pain Managment: Goal: General experience of comfort will improve Outcome: Progressing   

## 2021-05-05 NOTE — Progress Notes (Addendum)
PROGRESS NOTE    Patricia Tran  WJX:[id number] DOB: May 29, 1970 DOA: 05/04/2021 PCP: Ma Hillock, DO   Brief Narrative: 51 year old past medical history significant for thyroid cancer status post thyroidectomy, anxiety and depression, esophageal stenosis, uterine fibroid who presented to the ED complaining of abdominal pain and intractable vomiting that started around 6 PM the evening of admission.  She initially vomited clear then yellow dark in color fluid.  Last bowel movement was on Sunday, probably.  He has a history of cholecystectomy.  CT abdomen and pelvis: Partial small bowel obstruction with transition in the left upper quadrant.  Thickening endometrium 14 mm for postmenopausal age with either high density or enhancing material.  She had MRI pelvis at June 2022, recommend gynecology follow-up.  Intrahepatic bile duct dilation focally at the right lobe of liver.  Recommended follow-up for correlation with preoperative imaging.     Assessment & Plan:   Principal Problem:   SBO (small bowel obstruction) (HCC) Active Problems:   Anxiety   Thyroid cancer (HCC) s/p thyroidectomy   Esophageal stenosis   Thickened endometrium with fibroids    Thrombocytosis  1-Partial SBO Vs Gastroenteritis; -CT: partial small bowel obstruction, with transition left upper quadrant.  -Continue with NPO status.  -Surgery following.  -Change IV fluids to D 5 and KCL.  -KUB pending.  -Report improvement of abdominal pain, no vomiting since yesterday. No passing gas.  -Ambulation.   Acute Blood loss Anemia: GI bleed Vs hemodilution.  -Prior Hb eight month ago 12. Hb on admission 12.  -Hb down to 9. She denies back pain or current abdominal pain.  -Vomited yesterday black fluid content, dark green.  -Start IV Protonix.  -Repeat HB.  -GI consulted.  -Takes mobic twice a months. Continue to hold.   Hypokalemia: -Replete IV with 4 runs. Will add KCL to IV fluids.    Thrombocytosis: -Reactive from acute illness  Anxiety: -Continue with IV ativan.   Esophageal stenosis: Status post endoscopy 03/23/2021 with intrinsic moderate stenosis of esophagus, status post balloon dilation, gastric polyps.  No reported gastric ulcers. Followed by Dr. Hilarie Fredrickson  Thickened endometrium with fibroid; She follows with GYN.   History of thyroid cancer status post thyroidectomy Continue with Synthroid, will order IV.   History of Obesity:  BMI 28.  She has been making good progress.  She was stared on Mounjaro.      Estimated body mass index is 28.24 kg/m as calculated from the following:   Height as of this encounter: 5\' 3"  (1.6 m).   Weight as of this encounter: 72.3 kg.   DVT prophylaxis: SCD, no anticoagulation due to drop in hb.  Code Status: Full code Family Communication: Care discussed with patient, Husband who was at bedside.  Disposition Plan:  Status is: Inpatient  Remains inpatient appropriate because: admitted with partial SBO, required IV fluids.         Consultants:  GI General surgery   Procedures:  None  Antimicrobials:  None  Subjective: She is feeling better, she has not vomited since yesterday. She vomited initially clear  fluid, then yellow--- green---dark fluid content.  She is not passing gas yet. No BM.  She denies abdominal pain.   Objective: Vitals:   05/04/21 1729 05/04/21 2107 05/05/21 0023 05/05/21 0502  BP: 130/64 (!) 113/56 115/65 112/69  Pulse: 87 84 88 84  Resp: 18 17 17 18   Temp: 98.4 F (36.9 C) 98.1 F (36.7 C) 98.5 F (36.9 C) 98.4 F (36.9 C)  TempSrc: Oral Oral Oral Oral  SpO2: 100% 95% 94% 94%  Weight:      Height:        Intake/Output Summary (Last 24 hours) at 05/05/2021 0740 Last data filed at 05/05/2021 0600 Gross per 24 hour  Intake 2609.55 ml  Output --  Net 2609.55 ml   Filed Weights   05/04/21 0620 05/04/21 1600  Weight: 71.7 kg 72.3 kg    Examination:  General  exam: Appears calm and comfortable  Respiratory system: Clear to auscultation. Respiratory effort normal. Cardiovascular system: S1 & S2 heard, RRR.  Gastrointestinal system: Abdomen is nondistended, soft and nontender. No organomegaly or masses felt. BS positive Central nervous system: Alert and oriented. Follows command. Moves all 4 extremities.  Extremities: No edema, SCD in place.  Skin: No rashes, lesions or ulcers    Data Reviewed: I have personally reviewed following labs and imaging studies  CBC: Recent Labs  Lab 05/04/21 0625 05/05/21 0236  WBC 8.2 7.5  NEUTROABS 6.6  --   HGB 12.1 9.3*  HCT 37.7 30.1*  MCV 75.9* 78.6*  PLT 442* 782   Basic Metabolic Panel: Recent Labs  Lab 05/04/21 0625 05/05/21 0236  NA 139 135  K 4.5 3.1*  CL 102 107  CO2 24 20*  GLUCOSE 108* 77  BUN 15 13  CREATININE 0.99 0.87  CALCIUM 9.9 8.0*   GFR: Estimated Creatinine Clearance: 72.1 mL/min (by C-G formula based on SCr of 0.87 mg/dL). Liver Function Tests: Recent Labs  Lab 05/04/21 0625  AST 32  ALT 37  ALKPHOS 120  BILITOT 0.5  PROT 8.0  ALBUMIN 4.2   Recent Labs  Lab 05/04/21 0625  LIPASE 16   No results for input(s): AMMONIA in the last 168 hours. Coagulation Profile: No results for input(s): INR, PROTIME in the last 168 hours. Cardiac Enzymes: No results for input(s): CKTOTAL, CKMB, CKMBINDEX, TROPONINI in the last 168 hours. BNP (last 3 results) No results for input(s): PROBNP in the last 8760 hours. HbA1C: No results for input(s): HGBA1C in the last 72 hours. CBG: No results for input(s): GLUCAP in the last 168 hours. Lipid Profile: No results for input(s): CHOL, HDL, LDLCALC, TRIG, CHOLHDL, LDLDIRECT in the last 72 hours. Thyroid Function Tests: No results for input(s): TSH, T4TOTAL, FREET4, T3FREE, THYROIDAB in the last 72 hours. Anemia Panel: No results for input(s): VITAMINB12, FOLATE, FERRITIN, TIBC, IRON, RETICCTPCT in the last 72 hours. Sepsis  Labs: No results for input(s): PROCALCITON, LATICACIDVEN in the last 168 hours.  Recent Results (from the past 240 hour(s))  Resp Panel by RT-PCR (Flu A&B, Covid) Nasopharyngeal Swab     Status: None   Collection Time: 05/04/21  8:45 AM   Specimen: Nasopharyngeal Swab; Nasopharyngeal(NP) swabs in vial transport medium  Result Value Ref Range Status   SARS Coronavirus 2 by RT PCR NEGATIVE NEGATIVE Final    Comment: (NOTE) SARS-CoV-2 target nucleic acids are NOT DETECTED.  The SARS-CoV-2 RNA is generally detectable in upper respiratory specimens during the acute phase of infection. The lowest concentration of SARS-CoV-2 viral copies this assay can detect is 138 copies/mL. A negative result does not preclude SARS-Cov-2 infection and should not be used as the sole basis for treatment or other patient management decisions. A negative result may occur with  improper specimen collection/handling, submission of specimen other than nasopharyngeal swab, presence of viral mutation(s) within the areas targeted by this assay, and inadequate number of viral copies(<138 copies/mL). A negative result must be combined  with clinical observations, patient history, and epidemiological information. The expected result is Negative.  Fact Sheet for Patients:  EntrepreneurPulse.com.au  Fact Sheet for Healthcare Providers:  IncredibleEmployment.be  This test is no t yet approved or cleared by the [place] FDA and  has been authorized for detection and/or diagnosis of SARS-CoV-2 by FDA under an Emergency Use Authorization (EUA). This EUA will remain  in effect (meaning this test can be used) for the duration of the COVID-19 declaration under Section 564(b)(1) of the Act, 21 U.S.C.section 360bbb-3(b)(1), unless the authorization is terminated  or revoked sooner.       Influenza A by PCR NEGATIVE NEGATIVE Final   Influenza B by PCR NEGATIVE NEGATIVE Final     Comment: (NOTE) The Xpert Xpress SARS-CoV-2/FLU/RSV plus assay is intended as an aid in the diagnosis of influenza from Nasopharyngeal swab specimens and should not be used as a sole basis for treatment. Nasal washings and aspirates are unacceptable for Xpert Xpress SARS-CoV-2/FLU/RSV testing.  Fact Sheet for Patients: EntrepreneurPulse.com.au  Fact Sheet for Healthcare Providers: IncredibleEmployment.be  This test is not yet approved or cleared by the [place] FDA and has been authorized for detection and/or diagnosis of SARS-CoV-2 by FDA under an Emergency Use Authorization (EUA). This EUA will remain in effect (meaning this test can be used) for the duration of the COVID-19 declaration under Section 564(b)(1) of the Act, 21 U.S.C. section 360bbb-3(b)(1), unless the authorization is terminated or revoked.  Performed at [facility], [address] [place], [place] [address]   Surgical pcr screen     Status: None   Collection Time: 05/04/21  6:13 PM   Specimen: Nasal Mucosa; Nasal Swab  Result Value Ref Range Status   MRSA, PCR NEGATIVE NEGATIVE Final   Staphylococcus aureus NEGATIVE NEGATIVE Final    Comment: (NOTE) The Xpert SA Assay (FDA approved for NASAL specimens in patients 59 years of age and older), is one component of a comprehensive surveillance program. It is not intended to diagnose infection nor to guide or monitor treatment. Performed at [facility], [place] [address] [place], [place] [address]          Radiology Studies: CT ABDOMEN PELVIS W CONTRAST  Result Date: 05/04/2021 CLINICAL DATA:  Chest pain that started after several episodes of nausea and vomiting last night. EXAM: CT ABDOMEN AND PELVIS WITH CONTRAST TECHNIQUE: Multidetector CT imaging of the abdomen and pelvis was performed using the standard protocol following bolus administration of intravenous contrast. RADIATION DOSE  REDUCTION: This exam was performed according to the departmental dose-optimization program which includes automated exposure control, adjustment of the mA and/or kV according to patient size and/or use of iterative reconstruction technique. CONTRAST:  75mL OMNIPAQUE IOHEXOL 300 MG/ML  SOLN COMPARISON:  None. FINDINGS: Lower chest:  No contributory findings. Hepatobiliary: No focal liver abnormality.Cholecystectomy. Dilated intrahepatic ducts in the inferior right lobe liver without visible underlying stone or enhancing lesion. Pancreas: Unremarkable. Spleen: Unremarkable. Adrenals/Urinary Tract: Negative adrenals. No hydronephrosis or stone. Unremarkable bladder. Stomach/Bowel: Fluid-filled stomach and proximal small bowel with relative transition in the left upper quadrant where there is no focal abnormality. Mild left colonic diverticulosis. Vascular/Lymphatic: No acute vascular abnormality. No mass or adenopathy. Reproductive:Enlarged uterus. 4.4 cm intramural mass in the left body consistent with fibroid. Enhancing or otherwise high-density material in the endometrial cavity which is unexpectedly thickened for age at 14 mm. Other: No ascites or pneumoperitoneum. Musculoskeletal: No acute abnormalities. Lumbar facet degeneration with mild L2-3 anterolisthesis.  IMPRESSION: 1. Partial small bowel obstruction with transition in the left upper quadrant. 2. Thickened endometrium (14 mm) for postmenopausal age with either high-density or enhancing material. The patient had a pelvis MRI at [facility] June 2022, recommend gynecology follow-up. 3. Intrahepatic bile duct dilatation focally at the right lobe liver. Recommend follow-up for correlation with preoperative imaging (cholecystectomy). Electronically Signed   By: Jorje Guild M.D.   On: 05/04/2021 08:02        Scheduled Meds:  [START ON 05/08/2021] levothyroxine  12.5 mcg Intravenous Daily   pantoprazole (PROTONIX) IV  40 mg Intravenous Q12H   Continuous  Infusions:  lactated ringers 125 mL/hr at 05/05/21 0038   potassium chloride     promethazine (PHENERGAN) injection (IM or IVPB)       LOS: 1 day    Time spent: 35 minutes.     Elmarie Shiley, MD [facility]   If 7PM-7AM, please contact night-coverage [email or web address]  05/05/2021, 7:40 AM

## 2021-05-06 ENCOUNTER — Inpatient Hospital Stay (HOSPITAL_COMMUNITY): Payer: 59

## 2021-05-06 ENCOUNTER — Other Ambulatory Visit (HOSPITAL_COMMUNITY): Payer: Self-pay

## 2021-05-06 ENCOUNTER — Other Ambulatory Visit (HOSPITAL_BASED_OUTPATIENT_CLINIC_OR_DEPARTMENT_OTHER): Payer: Self-pay

## 2021-05-06 LAB — MAGNESIUM: Magnesium: 1.9 mg/dL (ref 1.7–2.4)

## 2021-05-06 LAB — BASIC METABOLIC PANEL
Anion gap: 7 (ref 5–15)
BUN: 5 mg/dL — ABNORMAL LOW (ref 6–20)
CO2: 23 mmol/L (ref 22–32)
Calcium: 8.2 mg/dL — ABNORMAL LOW (ref 8.9–10.3)
Chloride: 111 mmol/L (ref 98–111)
Creatinine, Ser: 0.71 mg/dL (ref 0.44–1.00)
GFR, Estimated: >60 mL/min (ref >60)
Glucose, Bld: 102 mg/dL — ABNORMAL HIGH (ref 70–99)
Potassium: 3.7 mmol/L (ref 3.5–5.1)
Sodium: 141 mmol/L (ref 135–145)

## 2021-05-06 LAB — CBC
HCT: 31.6 % — ABNORMAL LOW (ref 36.0–46.0)
Hemoglobin: 9.9 g/dL — ABNORMAL LOW (ref 12.0–15.0)
MCH: 24.8 pg — ABNORMAL LOW (ref 26.0–34.0)
MCHC: 31.3 g/dL (ref 30.0–36.0)
MCV: 79 fL — ABNORMAL LOW (ref 80.0–100.0)
Platelets: 325 10*3/uL (ref 150–400)
RBC: 4 MIL/uL (ref 3.87–5.11)
RDW: 16.8 % — ABNORMAL HIGH (ref 11.5–15.5)
WBC: 7.8 10*3/uL (ref 4.0–10.5)
nRBC: 0 % (ref 0.0–0.2)

## 2021-05-06 MED ORDER — GADOBUTROL 1 MMOL/ML IV SOLN
7.5000 mL | Freq: Once | INTRAVENOUS | Status: AC | PRN
  Administered 2021-05-06: 7.5 mL via INTRAVENOUS

## 2021-05-06 MED ORDER — POLYSACCHARIDE IRON COMPLEX 150 MG PO CAPS
150.0000 mg | ORAL_CAPSULE | Freq: Every day | ORAL | 0 refills | Status: DC
Start: 2021-05-06 — End: 2021-09-01
  Filled 2021-05-06 (×2): qty 30, 30d supply, fill #0

## 2021-05-06 MED ORDER — POLYETHYLENE GLYCOL 3350 17 G PO PACK
17.0000 g | PACK | Freq: Every day | ORAL | 0 refills | Status: DC
  Filled 2021-05-06: qty 14, 14d supply, fill #0

## 2021-05-06 MED ORDER — PANTOPRAZOLE SODIUM 40 MG PO TBEC
40.0000 mg | DELAYED_RELEASE_TABLET | Freq: Every day | ORAL | 1 refills | Status: DC
  Filled 2021-05-06 (×2): qty 30, 30d supply, fill #0
  Filled 2021-06-01: qty 30, 30d supply, fill #1

## 2021-05-06 MED ORDER — LEVOTHYROXINE SODIUM 50 MCG PO TABS
50.0000 ug | ORAL_TABLET | Freq: Every day | ORAL | Status: DC
  Administered 2021-05-06: 50 ug via ORAL
  Filled 2021-05-06: qty 1

## 2021-05-06 NOTE — Plan of Care (Signed)
°  Problem: Clinical Measurements: Goal: Will remain free from infection Outcome: Progressing   Problem: Clinical Measurements: Goal: Diagnostic test results will improve Outcome: Progressing   Problem: Nutrition: Goal: Adequate nutrition will be maintained Outcome: Progressing   Problem: Elimination: Goal: Will not experience complications related to bowel motility Outcome: Progressing

## 2021-05-06 NOTE — Plan of Care (Signed)
  Problem: Clinical Measurements: Goal: Will remain free from infection Outcome: Progressing Goal: Diagnostic test results will improve Outcome: Progressing   Problem: Nutrition: Goal: Adequate nutrition will be maintained Outcome: Progressing   

## 2021-05-06 NOTE — Progress Notes (Signed)
° ° ° °  [facility] Progress Note  CC:  Anemia and SBO  Subjective:  Feels fine.  Waiting to have MRI later today.  Objective:  Vital signs in last 24 hours: Temp:  [97.4 F (36.3 C)-98.4 F (36.9 C)] 98.1 F (36.7 C) (01/26 0556) Pulse Rate:  [80-84] 80 (01/26 0556) Resp:  [18] 18 (01/26 0556) BP: (106-119)/(62-72) 106/63 (01/26 0556) SpO2:  [96 %-100 %] 98 % (01/26 0556) Last BM Date: 05/05/21 General:  Alert,  Well-developed, in NAD Heart:  Regular rate and rhythm; no murmurs Pulm:  CTAB.  No W/R/R. Abdomen:  Soft, non-distended.  BS present.  Non-tender. Extremities:  Without edema. Neurologic:  Alert and oriented x 4;  grossly normal neurologically. Psych:  Alert and cooperative. Normal mood and affect.  Intake/Output from previous day: 01/25 0701 - 01/26 0700 In: 2730 [P.O.:1530; I.V.:1200] Out: -   Lab Results: Recent Labs    05/05/21 0236 05/05/21 0937 05/06/21 0334  WBC 7.5 5.1 7.8  HGB 9.3* 9.4* 9.9*  HCT 30.1* 30.0* 31.6*  PLT 321 282 325   BMET Recent Labs    05/04/21 0625 05/05/21 0236 05/06/21 0334  NA 139 135 141  K 4.5 3.1* 3.7  CL 102 107 111  CO2 24 20* 23  GLUCOSE 108* 77 102*  BUN 15 13 5*  CREATININE 0.99 0.87 0.71  CALCIUM 9.9 8.0* 8.2*   LFT Recent Labs    05/04/21 0625  PROT 8.0  ALBUMIN 4.2  AST 32  ALT 37  ALKPHOS 120  BILITOT 0.5   DG Abd Portable 1V  Result Date: 05/05/2021 CLINICAL DATA:  Small bowel obstruction EXAM: PORTABLE ABDOMEN - 1 VIEW COMPARISON:  None. FINDINGS: No dilated large or small bowel. Small gas in the rectum. Stool in descending colon cholecystectomy clips in the RIGHT upper quadrant. No pathologic calcifications IMPRESSION: Nonobstructive bowel gas pattern. Electronically Signed   By: Suzy Bouchard M.D.   On: 05/05/2021 08:45    Assessment / Plan: SBO:  Abnormal for age thickened endometrium with high density material in the endometrial cavity.  Follow-up x-ray imaging this  morning shows resolution SBO.  Surgery has advanced patient to full liquid diet with plans for soft diet if tolerated.   Microcytic anemia, new. Had fundic gland polyp, dilation of benign distal esophageal stenosis, small hiatal hernia on EGD 1 month ago.  Colonoscopy revealed diverticulosis 6 months ago.  Long hx of menaorrhagia related anemia.  Hgb stable at 9.9 grams.   Focal dilatation of intrahepatic duct in right liver.  Normal LFTs.      Menorrhagia.  Uterine fibroids.  Abnormal for age thickened endometrium.  Wonder if the high density material in the endometrial cavity may be blood?      GERD.  EGD as above.  No PPI etc at home.  Got IV Protonix this AM    S/p partial thyroidectomy for pappilary thyroid cancer.    -Await results of MRI liver. -Consider outpatient evaluation of small bowel if she does not respond to iron supplements and/or there if overt bleeding.  -Outpatient GYN follow-up recommended to further evaluate the thickened endometrium and uterine fibroid on CT.      LOS: 2 days   Patricia Tran. Patricia Tran  05/06/2021, 8:51 AM

## 2021-05-06 NOTE — TOC Transition Note (Signed)
Transition of Care [facility]) - CM/SW Discharge Note   Patient Details  Name: Patricia Tran MRN: [id number] Date of Birth: 04/17/1970  [facility]) CM/SW Contact:  Tom-Johnson, Renea Ee, RN Phone Number: 05/06/2021, 12:37 PM   Clinical Narrative:     Patient is scheduled for discharge today. No TOC needs noted. Outpatient followup instructions on AVS. Denies any needs. Husband to transport at discharge. No further TOC needs noted.  Final next level of care: Home/Self Care Barriers to Discharge: Barriers Resolved   Patient Goals and CMS Choice        Discharge Placement                       Discharge Plan and Services                DME Arranged: N/A DME Agency: NA       HH Arranged: NA HH Agency: NA        Social Determinants of Health (SDOH) Interventions     Readmission Risk Interventions No flowsheet data found.

## 2021-05-06 NOTE — Plan of Care (Signed)
°  Problem: Clinical Measurements: Goal: Will remain free from infection 05/06/2021 1137 by Dolores Hoose, RN Outcome: Adequate for Discharge 05/06/2021 0754 by Dolores Hoose, RN Outcome: Progressing Goal: Diagnostic test results will improve 05/06/2021 1137 by Dolores Hoose, RN Outcome: Adequate for Discharge 05/06/2021 0754 by Dolores Hoose, RN Outcome: Progressing   Problem: Nutrition: Goal: Adequate nutrition will be maintained 05/06/2021 1137 by Dolores Hoose, RN Outcome: Adequate for Discharge 05/06/2021 0754 by Dolores Hoose, RN Outcome: Progressing   Problem: Elimination: Goal: Will not experience complications related to bowel motility Outcome: Adequate for Discharge Goal: Will not experience complications related to urinary retention Outcome: Adequate for Discharge   Problem: Pain Managment: Goal: General experience of comfort will improve Outcome: Adequate for Discharge

## 2021-05-06 NOTE — Discharge Summary (Signed)
Physician Discharge Summary  Patricia Tran NTI:[id number] DOB: Sep 21, 1970 DOA: 05/04/2021  PCP: Ma Hillock, DO  Admit date: 05/04/2021 Discharge date: 05/06/2021  Admitted From: Home  Disposition:  Home  Recommendations for Outpatient Follow-up:  Follow up with PCP in 1-2 weeks Please obtain BMP/CBC in one week to follow hb and electrolytes.  Needs to follow up with GYN for further evaluation and treatment  of menorrhagia. Would like to avoid Mobic due to history of reflux.  Follow up with Dr Hilarie Fredrickson, to discussed further results of MRI liver and consideration of evaluation of Small bowel if she doesn't respond to iron supplement or has over bleeding.  Follow up with Dr Trixie Rude, to discuss if Darcel Bayley could have cause patient 's presentation.   Home Health:   Discharge Condition: Stable.  CODE STATUS: Full code Diet recommendation: Heart Healthy  Brief/Interim Summary: 51 year old past medical history significant for thyroid cancer status post thyroidectomy, anxiety and depression, esophageal stenosis, uterine fibroid who presented to the ED complaining of abdominal pain and intractable vomiting that started around 6 PM the evening of admission.  She initially vomited clear then yellow dark in color fluid.  Last bowel movement was on Sunday, probably. She has a history of cholecystectomy.   CT abdomen and pelvis: Partial small bowel obstruction with transition in the left upper quadrant.  Thickening endometrium 14 mm with either high density or enhancing material.  She had MRI pelvis at June 2022, recommend gynecology follow-up.  Intrahepatic bile duct dilation focally at the right lobe of liver.  Recommended follow-up for correlation with preoperative imaging.   1-Partial SBO Vs Gastroenteritis; -CT: partial small bowel obstruction, with transition left upper quadrant.  -She was treated initially with support care, IV fluids, NPO, correction of electrolytes.  -Surgery  Consulted, and assisted with management.  -She improved, abdominal pain resolved, had BM during hospitalization, tolerated diet.  -Stable for discharge.   Acute on chronic  Blood loss Anemia:  -Prior Hb eight month ago was at 12. Hb on admission 12.  -Suspect hemoglobin on admission  was hemo-concentrated. GI didn't have significant concern for melena/GI bleed. Acute on chronic anemia, could also be related to menorrhagia. Plan to discharge on Protonix. HB remain stable.  -GI consulted. No plan for endoscopy this admission. Continue Protonix daily at discharge.  -Takes mobic two days a months. Continue to hold to avoid gastritis, Esophagitis, with resent vomiting episodes.  -Follow up with GYN for further Tx of menorrhagia.  -Discharge on Iron supplement.    Hypokalemia: -Replaced.    Thrombocytosis: -Reactive from acute illness   Anxiety: -Continue with  ativan.    Esophageal stenosis: Status post endoscopy 03/23/2021 with intrinsic moderate stenosis of esophagus, status post balloon dilation, gastric polyps.  No reported gastric ulcers. Followed by Dr. Hilarie Fredrickson  Thickened endometrium with fibroid; She follows with GYN.  Correction to medical records:  Patient is not post menopausal.  Needs to follow up with GYN for further Tx options for menorrhagia.   History of thyroid cancer status post thyroidectomy Continue with Synthroid.    History of Obesity:  BMI 28.  She has been making good progress.  She was stared on Mounjaro.  Will defer to Dr Trixie Rude continuation of Mounjaro.    Focal dilation of intrahepatic Duct :  MRI liver: focal biliary duct dilation in the right hepatic lobe covering on cholecystectomy clip. Favor benign post surgical biliary obstruction.   -Needs follow up with Dr Hilarie Fredrickson.  -Consider follow up MRI  or CT in 6 months per radiologist. Follow Dr Hilarie Fredrickson recommendations.   Estimated body mass index is 28.24 kg/m as calculated from the following:   Height as  of this encounter: 5\' 3"  (1.6 m).   Weight as of this encounter: 72.3 kg.     Discharge Diagnoses:  Principal Problem:   SBO (small bowel obstruction) (HCC) Active Problems:   Anxiety   Thyroid cancer ([place]) s/p thyroidectomy   Esophageal stenosis   Thickened endometrium with fibroids    Thrombocytosis    Discharge Instructions  Discharge Instructions     Diet - low sodium heart healthy   Complete by: As directed    Increase activity slowly   Complete by: As directed       Allergies as of 05/06/2021       Reactions   Codeine Itching        Medication List     STOP taking these medications    dicyclomine 10 MG capsule Commonly known as: BENTYL   meloxicam 15 MG tablet Commonly known as: MOBIC   Mounjaro 2.5 MG/0.5ML Pen Generic drug: tirzepatide       TAKE these medications    buPROPion 300 MG 24 hr tablet Commonly known as: WELLBUTRIN XL Take 1 tablet (300 mg total) by mouth daily.   LORazepam 0.5 MG tablet Commonly known as: ATIVAN Take 1 tablet (0.5 mg total) by mouth daily as needed for anxiety.   multivitamin tablet Take 1 tablet by mouth daily.   pantoprazole 40 MG tablet Commonly known as: PROTONIX Take 1 tablet (40 mg total) by mouth daily at 6 (six) AM. Start taking on: May 07, 2021   polyethylene glycol 17 g packet Commonly known as: MIRALAX / GLYCOLAX Take 17 g by mouth daily.   Synthroid 50 MCG tablet Generic drug: levothyroxine Take 1 tablet (50 mcg total) by mouth daily. Take on an empty stomach with a glass of water at least 30-60 minutes before breakfast.   Vitamin D (Ergocalciferol) 1.25 MG (50000 UNIT) Caps capsule Commonly known as: DRISDOL Take 1 capsule (50,000 Units total) by mouth every 7 (seven) days.        Allergies  Allergen Reactions   Codeine Itching    Consultations: General Surgery GI    Procedures/Studies: CT ABDOMEN PELVIS W CONTRAST  Result Date: 05/04/2021 CLINICAL DATA:  Chest pain  that started after several episodes of nausea and vomiting last night. EXAM: CT ABDOMEN AND PELVIS WITH CONTRAST TECHNIQUE: Multidetector CT imaging of the abdomen and pelvis was performed using the standard protocol following bolus administration of intravenous contrast. RADIATION DOSE REDUCTION: This exam was performed according to the departmental dose-optimization program which includes automated exposure control, adjustment of the mA and/or kV according to patient size and/or use of iterative reconstruction technique. CONTRAST:  54mL OMNIPAQUE IOHEXOL 300 MG/ML  SOLN COMPARISON:  None. FINDINGS: Lower chest:  No contributory findings. Hepatobiliary: No focal liver abnormality.Cholecystectomy. Dilated intrahepatic ducts in the inferior right lobe liver without visible underlying stone or enhancing lesion. Pancreas: Unremarkable. Spleen: Unremarkable. Adrenals/Urinary Tract: Negative adrenals. No hydronephrosis or stone. Unremarkable bladder. Stomach/Bowel: Fluid-filled stomach and proximal small bowel with relative transition in the left upper quadrant where there is no focal abnormality. Mild left colonic diverticulosis. Vascular/Lymphatic: No acute vascular abnormality. No mass or adenopathy. Reproductive:Enlarged uterus. 4.4 cm intramural mass in the left body consistent with fibroid. Enhancing or otherwise high-density material in the endometrial cavity which is unexpectedly thickened for age at 14 mm. Other:  No ascites or pneumoperitoneum. Musculoskeletal: No acute abnormalities. Lumbar facet degeneration with mild L2-3 anterolisthesis. IMPRESSION: 1. Partial small bowel obstruction with transition in the left upper quadrant. 2. Thickened endometrium (14 mm) for postmenopausal age with either high-density or enhancing material. The patient had a pelvis MRI at [facility] June 2022, recommend gynecology follow-up. 3. Intrahepatic bile duct dilatation focally at the right lobe liver. Recommend follow-up for  correlation with preoperative imaging (cholecystectomy). Electronically Signed   By: Jorje Guild M.D.   On: 05/04/2021 08:02   DG Abd Portable 1V  Result Date: 05/05/2021 CLINICAL DATA:  Small bowel obstruction EXAM: PORTABLE ABDOMEN - 1 VIEW COMPARISON:  None. FINDINGS: No dilated large or small bowel. Small gas in the rectum. Stool in descending colon cholecystectomy clips in the RIGHT upper quadrant. No pathologic calcifications IMPRESSION: Nonobstructive bowel gas pattern. Electronically Signed   By: Suzy Bouchard M.D.   On: 05/05/2021 08:45     Subjective: She is feeling better, denies abdominal pain. She had 2 Bowel movement yesterday. Non today, but she is passing gas.    Discharge Exam: Vitals:   05/06/21 0556 05/06/21 0938  BP: 106/63 (!) 108/59  Pulse: 80 84  Resp: 18 16  Temp: 98.1 F (36.7 C) 98.3 F (36.8 C)  SpO2: 98% 97%     General: Pt is alert, awake, not in acute distress Cardiovascular: RRR, S1/S2 +, no rubs, no gallops Respiratory: CTA bilaterally, no wheezing, no rhonchi Abdominal: Soft, NT, ND, bowel sounds + Extremities: no edema, no cyanosis    The results of significant diagnostics from this hospitalization (including imaging, microbiology, ancillary and laboratory) are listed below for reference.     Microbiology: Recent Results (from the past 240 hour(s))  Resp Panel by RT-PCR (Flu A&B, Covid) Nasopharyngeal Swab     Status: None   Collection Time: 05/04/21  8:45 AM   Specimen: Nasopharyngeal Swab; Nasopharyngeal(NP) swabs in vial transport medium  Result Value Ref Range Status   SARS Coronavirus 2 by RT PCR NEGATIVE NEGATIVE Final    Comment: (NOTE) SARS-CoV-2 target nucleic acids are NOT DETECTED.  The SARS-CoV-2 RNA is generally detectable in upper respiratory specimens during the acute phase of infection. The lowest concentration of SARS-CoV-2 viral copies this assay can detect is 138 copies/mL. A negative result does not preclude  SARS-Cov-2 infection and should not be used as the sole basis for treatment or other patient management decisions. A negative result may occur with  improper specimen collection/handling, submission of specimen other than nasopharyngeal swab, presence of viral mutation(s) within the areas targeted by this assay, and inadequate number of viral copies(<138 copies/mL). A negative result must be combined with clinical observations, patient history, and epidemiological information. The expected result is Negative.  Fact Sheet for Patients:  EntrepreneurPulse.com.au  Fact Sheet for Healthcare Providers:  IncredibleEmployment.be  This test is no t yet approved or cleared by the [place] FDA and  has been authorized for detection and/or diagnosis of SARS-CoV-2 by FDA under an Emergency Use Authorization (EUA). This EUA will remain  in effect (meaning this test can be used) for the duration of the COVID-19 declaration under Section 564(b)(1) of the Act, 21 U.S.C.section 360bbb-3(b)(1), unless the authorization is terminated  or revoked sooner.       Influenza A by PCR NEGATIVE NEGATIVE Final   Influenza B by PCR NEGATIVE NEGATIVE Final    Comment: (NOTE) The Xpert Xpress SARS-CoV-2/FLU/RSV plus assay is intended as an aid in the diagnosis of  influenza from Nasopharyngeal swab specimens and should not be used as a sole basis for treatment. Nasal washings and aspirates are unacceptable for Xpert Xpress SARS-CoV-2/FLU/RSV testing.  Fact Sheet for Patients: EntrepreneurPulse.com.au  Fact Sheet for Healthcare Providers: IncredibleEmployment.be  This test is not yet approved or cleared by the [place] FDA and has been authorized for detection and/or diagnosis of SARS-CoV-2 by FDA under an Emergency Use Authorization (EUA). This EUA will remain in effect (meaning this test can be used) for the duration of  the COVID-19 declaration under Section 564(b)(1) of the Act, 21 U.S.C. section 360bbb-3(b)(1), unless the authorization is terminated or revoked.  Performed at [facility], [address] [place], [place] [address]   Surgical pcr screen     Status: None   Collection Time: 05/04/21  6:13 PM   Specimen: Nasal Mucosa; Nasal Swab  Result Value Ref Range Status   MRSA, PCR NEGATIVE NEGATIVE Final   Staphylococcus aureus NEGATIVE NEGATIVE Final    Comment: (NOTE) The Xpert SA Assay (FDA approved for NASAL specimens in patients 8 years of age and older), is one component of a comprehensive surveillance program. It is not intended to diagnose infection nor to guide or monitor treatment. Performed at [facility], [place] [address] [place], [place] [address]      Labs: BNP (last 3 results) No results for input(s): BNP in the last 8760 hours. Basic Metabolic Panel: Recent Labs  Lab 05/04/21 0625 05/05/21 0236 05/06/21 0334  NA 139 135 141  K 4.5 3.1* 3.7  CL 102 107 111  CO2 24 20* 23  GLUCOSE 108* 77 102*  BUN 15 13 5*  CREATININE 0.99 0.87 0.71  CALCIUM 9.9 8.0* 8.2*  MG  --  1.9 1.9   Liver Function Tests: Recent Labs  Lab 05/04/21 0625  AST 32  ALT 37  ALKPHOS 120  BILITOT 0.5  PROT 8.0  ALBUMIN 4.2   Recent Labs  Lab 05/04/21 0625  LIPASE 16   No results for input(s): AMMONIA in the last 168 hours. CBC: Recent Labs  Lab 05/04/21 0625 05/05/21 0236 05/05/21 0937 05/06/21 0334  WBC 8.2 7.5 5.1 7.8  NEUTROABS 6.6  --   --   --   HGB 12.1 9.3* 9.4* 9.9*  HCT 37.7 30.1* 30.0* 31.6*  MCV 75.9* 78.6* 78.7* 79.0*  PLT 442* 321 282 325   Cardiac Enzymes: No results for input(s): CKTOTAL, CKMB, CKMBINDEX, TROPONINI in the last 168 hours. BNP: Invalid input(s): POCBNP CBG: Recent Labs  Lab 05/05/21 0911 05/05/21 1224  GLUCAP 64* 98   D-Dimer No results for input(s): DDIMER in the last 72 hours. Hgb A1c No results for  input(s): HGBA1C in the last 72 hours. Lipid Profile No results for input(s): CHOL, HDL, LDLCALC, TRIG, CHOLHDL, LDLDIRECT in the last 72 hours. Thyroid function studies No results for input(s): TSH, T4TOTAL, T3FREE, THYROIDAB in the last 72 hours.  Invalid input(s): FREET3 Anemia work up Recent Labs    05/05/21 0236 05/05/21 0937  VITAMINB12  --  513  FOLATE  --  21.5  FERRITIN  --  7*  TIBC  --  349  IRON  --  52  RETICCTPCT 0.8  --    Urinalysis    Component Value Date/Time   COLORURINE YELLOW 05/04/2021 Anza 05/04/2021 0717   LABSPEC 1.017 05/04/2021 0717   PHURINE 8.5 (H) 05/04/2021 0717   GLUCOSEU NEGATIVE 05/04/2021 0717   HGBUR NEGATIVE  05/04/2021 Alvin 05/04/2021 0717   KETONESUR 15 (A) 05/04/2021 0717   PROTEINUR 30 (A) 05/04/2021 0717   NITRITE NEGATIVE 05/04/2021 0717   LEUKOCYTESUR NEGATIVE 05/04/2021 0717   Sepsis Labs Invalid input(s): PROCALCITONIN,  WBC,  LACTICIDVEN Microbiology Recent Results (from the past 240 hour(s))  Resp Panel by RT-PCR (Flu A&B, Covid) Nasopharyngeal Swab     Status: None   Collection Time: 05/04/21  8:45 AM   Specimen: Nasopharyngeal Swab; Nasopharyngeal(NP) swabs in vial transport medium  Result Value Ref Range Status   SARS Coronavirus 2 by RT PCR NEGATIVE NEGATIVE Final    Comment: (NOTE) SARS-CoV-2 target nucleic acids are NOT DETECTED.  The SARS-CoV-2 RNA is generally detectable in upper respiratory specimens during the acute phase of infection. The lowest concentration of SARS-CoV-2 viral copies this assay can detect is 138 copies/mL. A negative result does not preclude SARS-Cov-2 infection and should not be used as the sole basis for treatment or other patient management decisions. A negative result may occur with  improper specimen collection/handling, submission of specimen other than nasopharyngeal swab, presence of viral mutation(s) within the areas targeted by this  assay, and inadequate number of viral copies(<138 copies/mL). A negative result must be combined with clinical observations, patient history, and epidemiological information. The expected result is Negative.  Fact Sheet for Patients:  EntrepreneurPulse.com.au  Fact Sheet for Healthcare Providers:  IncredibleEmployment.be  This test is no t yet approved or cleared by the [place] FDA and  has been authorized for detection and/or diagnosis of SARS-CoV-2 by FDA under an Emergency Use Authorization (EUA). This EUA will remain  in effect (meaning this test can be used) for the duration of the COVID-19 declaration under Section 564(b)(1) of the Act, 21 U.S.C.section 360bbb-3(b)(1), unless the authorization is terminated  or revoked sooner.       Influenza A by PCR NEGATIVE NEGATIVE Final   Influenza B by PCR NEGATIVE NEGATIVE Final    Comment: (NOTE) The Xpert Xpress SARS-CoV-2/FLU/RSV plus assay is intended as an aid in the diagnosis of influenza from Nasopharyngeal swab specimens and should not be used as a sole basis for treatment. Nasal washings and aspirates are unacceptable for Xpert Xpress SARS-CoV-2/FLU/RSV testing.  Fact Sheet for Patients: EntrepreneurPulse.com.au  Fact Sheet for Healthcare Providers: IncredibleEmployment.be  This test is not yet approved or cleared by the [place] FDA and has been authorized for detection and/or diagnosis of SARS-CoV-2 by FDA under an Emergency Use Authorization (EUA). This EUA will remain in effect (meaning this test can be used) for the duration of the COVID-19 declaration under Section 564(b)(1) of the Act, 21 U.S.C. section 360bbb-3(b)(1), unless the authorization is terminated or revoked.  Performed at [facility], [address], [place], [place] [address]   Surgical pcr screen     Status: None   Collection Time: 05/04/21   6:13 PM   Specimen: Nasal Mucosa; Nasal Swab  Result Value Ref Range Status   MRSA, PCR NEGATIVE NEGATIVE Final   Staphylococcus aureus NEGATIVE NEGATIVE Final    Comment: (NOTE) The Xpert SA Assay (FDA approved for NASAL specimens in patients 75 years of age and older), is one component of a comprehensive surveillance program. It is not intended to diagnose infection nor to guide or monitor treatment. Performed at [facility], [place] [address], [place], [place] [address]      Time coordinating discharge: 40 minutes  SIGNED:   Elmarie Shiley, MD  [facility]

## 2021-05-06 NOTE — Progress Notes (Signed)
DISCHARGE NOTE HOME Patricia Tran to be discharged Home per MD order. Discussed prescriptions and follow up appointments with the patient. Prescriptions given to patient; medication list explained in detail. Patient verbalized understanding.  Skin clean, dry and intact without evidence of skin break down, no evidence of skin tears noted. IV catheter discontinued intact. Site without signs and symptoms of complications. Dressing and pressure applied. Pt denies pain at the site currently. No complaints noted.  Patient free of lines, drains, and wounds.   An After Visit Summary (AVS) was printed and given to the patient. Patient escorted via wheelchair, and discharged home via private auto.  Dolores Hoose, RN

## 2021-05-07 ENCOUNTER — Other Ambulatory Visit (HOSPITAL_COMMUNITY): Payer: Self-pay

## 2021-05-10 ENCOUNTER — Ambulatory Visit: Payer: 59 | Admitting: Physical Therapy

## 2021-05-11 ENCOUNTER — Other Ambulatory Visit: Payer: Self-pay

## 2021-05-11 ENCOUNTER — Other Ambulatory Visit (HOSPITAL_COMMUNITY): Payer: Self-pay

## 2021-05-11 ENCOUNTER — Ambulatory Visit (INDEPENDENT_AMBULATORY_CARE_PROVIDER_SITE_OTHER): Payer: 59 | Admitting: Family Medicine

## 2021-05-11 ENCOUNTER — Encounter (INDEPENDENT_AMBULATORY_CARE_PROVIDER_SITE_OTHER): Payer: Self-pay | Admitting: Family Medicine

## 2021-05-11 VITALS — BP 126/73 | HR 103 | Temp 97.9°F | Ht 63.0 in | Wt 160.0 lb

## 2021-05-11 DIAGNOSIS — E669 Obesity, unspecified: Secondary | ICD-10-CM

## 2021-05-11 DIAGNOSIS — R7303 Prediabetes: Secondary | ICD-10-CM

## 2021-05-11 DIAGNOSIS — E559 Vitamin D deficiency, unspecified: Secondary | ICD-10-CM

## 2021-05-11 DIAGNOSIS — Z6828 Body mass index (BMI) 28.0-28.9, adult: Secondary | ICD-10-CM | POA: Diagnosis not present

## 2021-05-11 MED ORDER — VITAMIN D (ERGOCALCIFEROL) 1.25 MG (50000 UNIT) PO CAPS
50000.0000 [IU] | ORAL_CAPSULE | ORAL | 0 refills | Status: DC
Start: 2021-05-11 — End: 2021-06-01
  Filled 2021-05-11: qty 4, 28d supply, fill #0

## 2021-05-11 MED ORDER — TIRZEPATIDE 2.5 MG/0.5ML ~~LOC~~ SOAJ
2.5000 mg | SUBCUTANEOUS | 0 refills | Status: DC
  Filled 2021-05-11: qty 2, 28d supply, fill #0

## 2021-05-11 NOTE — Progress Notes (Signed)
Chief Complaint:   OBESITY Patricia Tran is here to discuss her progress with her obesity treatment plan along with follow-up of her obesity related diagnoses. Patricia Tran is on the Category 2 Plan and states she is following her eating plan approximately 40% of the time. Patricia Tran states she is doing 0 minutes 0 times per week.  Today's visit was #: 15 Starting weight: 185 lbs Starting date: 08/11/2020 Today's weight: 160 lbs Today's date: 05/11/2021 Total lbs lost to date: 25 Total lbs lost since last in-office visit: 0  Interim History: Patricia Tran has done well with maintaining her weight. She was admitted into the hospital with a suspected SBO which spontaneously cleared at the time of admission. She feels better now and she has tolerated a full diet since.  Subjective:   1. Pre-diabetes Patricia Tran had her Mounjaro discontinued while hospitalized. She would like to restart again.  2. Vitamin D deficiency Patricia Tran is stable on Vit D with no side effects noted.  Assessment/Plan:   1. Pre-diabetes Patricia Tran agreed to restart Mounjaro 2.5 mg weekly with no refills. Risks and benefits were discussed including the risk of delayed gastric empty and theoretic risk of another SBO. Will progress very cautiously.   - tirzepatide [facility]) 2.5 MG/0.5ML Pen; Inject 2.5 mg into the skin once a week.  Dispense: 2 mL; Refill: 0  2. Vitamin D deficiency We will refill prescription Vitamin D for 1 month. Patricia Tran will follow-up for routine testing of Vitamin D, at least 2-3 times per year to avoid over-replacement.  - Vitamin D, Ergocalciferol, (DRISDOL) 1.25 MG (50000 UNIT) CAPS capsule; Take 1 capsule (50,000 Units total) by mouth every 7 (seven) days.  Dispense: 4 capsule; Refill: 0  3. Obesity BMI today is 28.3 Lesieli is currently in the action stage of change. As such, her goal is to continue with weight loss efforts. She has agreed to the Category 2 Plan.   Behavioral modification strategies: increasing water intake.  Patricia Tran  has agreed to follow-up with our clinic in 3 to 4 weeks. She was informed of the importance of frequent follow-up visits to maximize her success with intensive lifestyle modifications for her multiple health conditions.   Objective:   Blood pressure 126/73, pulse (!) 103, temperature 97.9 F (36.6 C), height 5\' 3"  (1.6 m), weight 160 lb (72.6 kg), last menstrual period 04/29/2021, SpO2 99 %. Body mass index is 28.34 kg/m.  General: Cooperative, alert, well developed, in no acute distress. HEENT: Conjunctivae and lids unremarkable. Cardiovascular: Regular rhythm.  Lungs: Normal work of breathing. Neurologic: No focal deficits.   Lab Results  Component Value Date   CREATININE 0.71 05/06/2021   BUN 5 (L) 05/06/2021   NA 141 05/06/2021   K 3.7 05/06/2021   CL 111 05/06/2021   CO2 23 05/06/2021   Lab Results  Component Value Date   ALT 37 05/04/2021   AST 32 05/04/2021   ALKPHOS 120 05/04/2021   BILITOT 0.5 05/04/2021   Lab Results  Component Value Date   HGBA1C 5.5 12/01/2020   HGBA1C 5.4 08/11/2020   HGBA1C 5.4 10/01/2018   Lab Results  Component Value Date   INSULIN 9.0 12/01/2020   INSULIN 10.2 08/11/2020   Lab Results  Component Value Date   TSH 1.260 03/11/2021   Lab Results  Component Value Date   CHOL 194 12/01/2020   HDL 38 (L) 12/01/2020   LDLCALC 136 (H) 12/01/2020   TRIG 109 12/01/2020   Lab Results  Component Value Date  VD25OH 57.0 12/01/2020   VD25OH 33.7 08/11/2020   Lab Results  Component Value Date   WBC 7.8 05/06/2021   HGB 9.9 (L) 05/06/2021   HCT 31.6 (L) 05/06/2021   MCV 79.0 (L) 05/06/2021   PLT 325 05/06/2021   Lab Results  Component Value Date   IRON 52 05/05/2021   TIBC 349 05/05/2021   FERRITIN 7 (L) 05/05/2021   Attestation Statements:   Reviewed by clinician on day of visit: allergies, medications, problem list, medical history, surgical history, family history, social history, and previous encounter notes.   I,  Trixie Dredge, am acting as transcriptionist for Dennard Nip, MD.  I have reviewed the above documentation for accuracy and completeness, and I agree with the above. -  Dennard Nip, MD

## 2021-06-01 ENCOUNTER — Encounter (INDEPENDENT_AMBULATORY_CARE_PROVIDER_SITE_OTHER): Payer: Self-pay | Admitting: Family Medicine

## 2021-06-01 ENCOUNTER — Other Ambulatory Visit (INDEPENDENT_AMBULATORY_CARE_PROVIDER_SITE_OTHER): Payer: Self-pay | Admitting: Family Medicine

## 2021-06-01 ENCOUNTER — Other Ambulatory Visit (HOSPITAL_COMMUNITY): Payer: Self-pay

## 2021-06-01 DIAGNOSIS — R7303 Prediabetes: Secondary | ICD-10-CM

## 2021-06-01 DIAGNOSIS — E559 Vitamin D deficiency, unspecified: Secondary | ICD-10-CM

## 2021-06-01 MED ORDER — VITAMIN D (ERGOCALCIFEROL) 1.25 MG (50000 UNIT) PO CAPS
50000.0000 [IU] | ORAL_CAPSULE | ORAL | 0 refills | Status: DC
  Filled 2021-06-01: qty 4, 28d supply, fill #0

## 2021-06-01 MED ORDER — MOUNJARO 2.5 MG/0.5ML ~~LOC~~ SOAJ
2.5000 mg | SUBCUTANEOUS | 0 refills | Status: DC
  Filled 2021-06-01: qty 2, 28d supply, fill #0

## 2021-06-01 NOTE — Telephone Encounter (Signed)
LAST APPOINTMENT DATE: 05/11/21 NEXT APPOINTMENT DATE: 06/24/21   [facility] [address] [place] [address] Phone: [phone] Fax: [facility] [place] [address] Phone: [phone] Fax: [phone]  Patient is requesting a refill of the following medications: Requested Prescriptions   Pending Prescriptions Disp Refills   Vitamin D, Ergocalciferol, (DRISDOL) 1.25 MG (50000 UNIT) CAPS capsule 4 capsule 0    Sig: Take 1 capsule (50,000 Units total) by mouth every 7 (seven) days.   tirzepatide (MOUNJARO) 2.5 MG/0.5ML Pen 2 mL 0    Sig: Inject 2.5 mg into the skin once a week.    Date last filled: 05/11/21 Previously prescribed by DR Leafy Ro  Lab Results  Component Value Date   HGBA1C 5.5 12/01/2020   HGBA1C 5.4 08/11/2020   HGBA1C 5.4 10/01/2018   Lab Results  Component Value Date   LDLCALC 136 (H) 12/01/2020   CREATININE 0.71 05/06/2021   Lab Results  Component Value Date   VD25OH 57.0 12/01/2020   VD25OH 33.7 08/11/2020    BP Readings from Last 3 Encounters:  05/11/21 126/73  05/06/21 (!) 108/59  04/13/21 131/80

## 2021-06-01 NOTE — Telephone Encounter (Signed)
Patricia Tran, please let pt know she was told to f/up in 3-4 wks at her appt on 05/11/21.  This is VERY important so we may titrate her mounjaro as indicated clinically.   She needs a f/up appt to do this.   Tell her I will only RF these meds for one month, this ONE TIME, but she will need to be seen regularly q 3-4 wks while on mounjaro for future RF's.   Only ok to rf 30 day supply and no RF this one time

## 2021-06-02 ENCOUNTER — Other Ambulatory Visit (HOSPITAL_COMMUNITY): Payer: Self-pay

## 2021-06-02 ENCOUNTER — Encounter: Payer: Self-pay | Admitting: Family Medicine

## 2021-06-02 ENCOUNTER — Other Ambulatory Visit (INDEPENDENT_AMBULATORY_CARE_PROVIDER_SITE_OTHER): Payer: Self-pay

## 2021-06-02 DIAGNOSIS — H5213 Myopia, bilateral: Secondary | ICD-10-CM | POA: Diagnosis not present

## 2021-06-02 DIAGNOSIS — R7303 Prediabetes: Secondary | ICD-10-CM

## 2021-06-02 DIAGNOSIS — E559 Vitamin D deficiency, unspecified: Secondary | ICD-10-CM

## 2021-06-02 MED ORDER — MOUNJARO 2.5 MG/0.5ML ~~LOC~~ SOAJ
2.5000 mg | SUBCUTANEOUS | 0 refills | Status: DC
  Filled 2021-06-02: qty 6, 84d supply, fill #0

## 2021-06-02 MED ORDER — VITAMIN D (ERGOCALCIFEROL) 1.25 MG (50000 UNIT) PO CAPS
50000.0000 [IU] | ORAL_CAPSULE | ORAL | 0 refills | Status: DC
  Filled 2021-06-02: qty 12, 84d supply, fill #0

## 2021-06-02 MED ORDER — PANTOPRAZOLE SODIUM 40 MG PO TBEC
40.0000 mg | DELAYED_RELEASE_TABLET | Freq: Every day | ORAL | 1 refills | Status: DC
  Filled 2021-06-02: qty 90, 90d supply, fill #0

## 2021-06-02 NOTE — Telephone Encounter (Signed)
Please advise if approved for scheduling an 11:30 or 4 pm slot before 02/28

## 2021-06-02 NOTE — Telephone Encounter (Signed)
Refilled pantoprazole for her

## 2021-06-03 ENCOUNTER — Other Ambulatory Visit (HOSPITAL_COMMUNITY): Payer: Self-pay

## 2021-06-03 NOTE — Telephone Encounter (Signed)
No further action needed.

## 2021-06-15 ENCOUNTER — Ambulatory Visit (INDEPENDENT_AMBULATORY_CARE_PROVIDER_SITE_OTHER): Payer: 59 | Admitting: Family Medicine

## 2021-06-24 ENCOUNTER — Other Ambulatory Visit (HOSPITAL_COMMUNITY): Payer: Self-pay

## 2021-06-24 ENCOUNTER — Encounter (INDEPENDENT_AMBULATORY_CARE_PROVIDER_SITE_OTHER): Payer: Self-pay | Admitting: Family Medicine

## 2021-06-24 ENCOUNTER — Other Ambulatory Visit: Payer: Self-pay

## 2021-06-24 ENCOUNTER — Ambulatory Visit (INDEPENDENT_AMBULATORY_CARE_PROVIDER_SITE_OTHER): Payer: Commercial Managed Care - PPO | Admitting: Family Medicine

## 2021-06-24 VITALS — BP 114/68 | HR 78 | Temp 97.7°F | Ht 63.0 in | Wt 154.0 lb

## 2021-06-24 DIAGNOSIS — R7303 Prediabetes: Secondary | ICD-10-CM

## 2021-06-24 DIAGNOSIS — E669 Obesity, unspecified: Secondary | ICD-10-CM

## 2021-06-24 DIAGNOSIS — Z6827 Body mass index (BMI) 27.0-27.9, adult: Secondary | ICD-10-CM

## 2021-06-24 MED ORDER — TIRZEPATIDE 5 MG/0.5ML ~~LOC~~ SOAJ
5.0000 mg | SUBCUTANEOUS | 0 refills | Status: DC
  Filled 2021-06-24 – 2021-06-30 (×2): qty 6, 84d supply, fill #0

## 2021-06-29 NOTE — Progress Notes (Signed)
? ? ? ?Chief Complaint:  ? ?OBESITY ?Patricia Tran is here to discuss her progress with her obesity treatment plan along with follow-up of her obesity related diagnoses. Patricia Tran is on the Category 2 Plan and states she is following her eating plan approximately 75% of the time. Patricia Tran states she is doing 0 minutes 0 times per week. ? ?Today's visit was #: 16 ?Starting weight: 185 lbs ?Starting date: 08/11/2020 ?Today's weight: 154 lbs ?Today's date: 06/24/2021 ?Total lbs lost to date: 63 ?Total lbs lost since last in-office visit: 6 ? ?Interim History: Patricia Tran continues to do well with weight loss. She notes her hunger has increased recently. ? ?Subjective:  ? ?1. Pre-diabetes ?Patricia Tran notes increased polyphagia in the last month. She wonders if increasing Mounjaro would be helpful. ? ?2. At risk for impaired metabolic function ?Patricia Tran is at increased risk for impaired metabolic function if calories or protein decreases. ? ?Assessment/Plan:  ? ?1. Pre-diabetes ?Patricia Tran agreed to increase Mounjaro to 5 mg, and we will refill for 90 days with no refills. She will continue to work on weight loss, exercise, and decreasing simple carbohydrates to help decrease the risk of diabetes.  ? ?- tirzepatide (MOUNJARO) 5 MG/0.5ML Pen; Inject 5 mg into the skin once a week.  Dispense: 6 mL; Refill: 0 ? ?2. At risk for impaired metabolic function ?Patricia Tran was given approximately 15 minutes of impaired  metabolic function prevention counseling today. We discussed intensive lifestyle modifications today with an emphasis on specific nutrition and exercise instructions and strategies.  ? ?Repetitive spaced learning was employed today to elicit superior memory formation and behavioral change. ? ?3. Obesity BMI today is 27.3 ?Garnette is currently in the action stage of change. As such, her goal is to continue with weight loss efforts. She has agreed to the Category 2 Plan.  ? ?Exercise goals: For substantial health benefits, adults should do at least 150 minutes (2  hours and 30 minutes) a week of moderate-intensity, or 75 minutes (1 hour and 15 minutes) a week of vigorous-intensity aerobic physical activity, or an equivalent combination of moderate- and vigorous-intensity aerobic activity. Aerobic activity should be performed in episodes of at least 10 minutes, and preferably, it should be spread throughout the week. ? ?Behavioral modification strategies: increasing lean protein intake. ? ?Patricia Tran has agreed to follow-up with our clinic in 4 weeks. She was informed of the importance of frequent follow-up visits to maximize her success with intensive lifestyle modifications for her multiple health conditions.  ? ?Objective:  ? ?Blood pressure 114/68, pulse 78, temperature 97.7 ?F (36.5 ?C), height '5\' 3"'$  (1.6 m), weight 154 lb (69.9 kg), SpO2 95 %. ?Body mass index is 27.28 kg/m?. ? ?General: Cooperative, alert, well developed, in no acute distress. ?HEENT: Conjunctivae and lids unremarkable. ?Cardiovascular: Regular rhythm.  ?Lungs: Normal work of breathing. ?Neurologic: No focal deficits.  ? ?Lab Results  ?Component Value Date  ? CREATININE 0.71 05/06/2021  ? BUN 5 (L) 05/06/2021  ? NA 141 05/06/2021  ? K 3.7 05/06/2021  ? CL 111 05/06/2021  ? CO2 23 05/06/2021  ? ?Lab Results  ?Component Value Date  ? ALT 37 05/04/2021  ? AST 32 05/04/2021  ? ALKPHOS 120 05/04/2021  ? BILITOT 0.5 05/04/2021  ? ?Lab Results  ?Component Value Date  ? HGBA1C 5.5 12/01/2020  ? HGBA1C 5.4 08/11/2020  ? HGBA1C 5.4 10/01/2018  ? ?Lab Results  ?Component Value Date  ? INSULIN 9.0 12/01/2020  ? INSULIN 10.2 08/11/2020  ? ?Lab Results  ?  Component Value Date  ? TSH 1.260 03/11/2021  ? ?Lab Results  ?Component Value Date  ? CHOL 194 12/01/2020  ? HDL 38 (L) 12/01/2020  ? LDLCALC 136 (H) 12/01/2020  ? TRIG 109 12/01/2020  ? ?Lab Results  ?Component Value Date  ? VD25OH 57.0 12/01/2020  ? VD25OH 33.7 08/11/2020  ? ?Lab Results  ?Component Value Date  ? WBC 7.8 05/06/2021  ? HGB 9.9 (L) 05/06/2021  ? HCT 31.6  (L) 05/06/2021  ? MCV 79.0 (L) 05/06/2021  ? PLT 325 05/06/2021  ? ?Lab Results  ?Component Value Date  ? IRON 52 05/05/2021  ? TIBC 349 05/05/2021  ? FERRITIN 7 (L) 05/05/2021  ? ?Attestation Statements:  ? ?Reviewed by clinician on day of visit: allergies, medications, problem list, medical history, surgical history, family history, social history, and previous encounter notes. ? ? ?I, Trixie Dredge, am acting as transcriptionist for Dennard Nip, MD. ? ?I have reviewed the above documentation for accuracy and completeness, and I agree with the above. -  Dennard Nip, MD ? ? ?

## 2021-06-30 ENCOUNTER — Other Ambulatory Visit (HOSPITAL_COMMUNITY): Payer: Self-pay

## 2021-07-06 ENCOUNTER — Other Ambulatory Visit (HOSPITAL_COMMUNITY): Payer: Self-pay

## 2021-07-19 ENCOUNTER — Ambulatory Visit (INDEPENDENT_AMBULATORY_CARE_PROVIDER_SITE_OTHER): Payer: Self-pay | Admitting: Family Medicine

## 2021-07-29 ENCOUNTER — Encounter (INDEPENDENT_AMBULATORY_CARE_PROVIDER_SITE_OTHER): Payer: Self-pay | Admitting: Family Medicine

## 2021-07-29 ENCOUNTER — Other Ambulatory Visit (HOSPITAL_COMMUNITY): Payer: Self-pay

## 2021-07-29 ENCOUNTER — Ambulatory Visit (INDEPENDENT_AMBULATORY_CARE_PROVIDER_SITE_OTHER): Payer: Commercial Managed Care - PPO | Admitting: Family Medicine

## 2021-07-29 VITALS — BP 143/78 | HR 84 | Temp 97.8°F | Ht 63.0 in | Wt 153.0 lb

## 2021-07-29 DIAGNOSIS — E669 Obesity, unspecified: Secondary | ICD-10-CM

## 2021-07-29 DIAGNOSIS — R7303 Prediabetes: Secondary | ICD-10-CM | POA: Diagnosis not present

## 2021-07-29 DIAGNOSIS — Z6827 Body mass index (BMI) 27.0-27.9, adult: Secondary | ICD-10-CM

## 2021-07-29 DIAGNOSIS — G4709 Other insomnia: Secondary | ICD-10-CM | POA: Diagnosis not present

## 2021-07-29 MED ORDER — TRAZODONE HCL 50 MG PO TABS
50.0000 mg | ORAL_TABLET | Freq: Every evening | ORAL | 0 refills | Status: AC | PRN
  Filled 2021-07-29 – 2021-08-10 (×2): qty 30, 30d supply, fill #0

## 2021-07-29 MED ORDER — TIRZEPATIDE 2.5 MG/0.5ML ~~LOC~~ SOAJ
2.5000 mg | SUBCUTANEOUS | 0 refills | Status: DC
  Filled 2021-07-29 (×3): qty 2, 28d supply, fill #0

## 2021-08-06 ENCOUNTER — Other Ambulatory Visit (HOSPITAL_COMMUNITY): Payer: Self-pay

## 2021-08-10 ENCOUNTER — Other Ambulatory Visit (HOSPITAL_COMMUNITY): Payer: Self-pay

## 2021-08-10 MED ORDER — MELOXICAM 15 MG PO TABS
15.0000 mg | ORAL_TABLET | Freq: Every day | ORAL | 0 refills | Status: DC
  Filled 2021-08-10: qty 30, 30d supply, fill #0

## 2021-08-11 NOTE — Progress Notes (Signed)
? ? ? ?Chief Complaint:  ? ?OBESITY ?Patricia Tran is here to discuss her progress with her obesity treatment plan along with follow-up of her obesity related diagnoses. Patricia Tran is on the Category 2 Plan and states she is following her eating plan approximately 75% of the time. Patricia Tran states she is doing body weights for 10 minutes 3 times per week. ? ?Today's visit was #: 4 ?Starting weight: 185 lbs ?Starting date: 08/11/2020 ?Today's weight: 153 lbs ?Today's date: 07/29/2021 ?Total lbs lost to date: 66 ?Total lbs lost since last in-office visit: 1 ? ?Interim History: Patricia Tran continues to do well with weight loss. Her hunger is mostly controlled and she is doing well with making better food choices.  ? ?Subjective:  ? ?1. Pre-diabetes ?Patricia Tran has continued on Mounjaro at 2.5 mg.  ? ?2. Other insomnia ?Patricia Tran has some Ativan that she uses for anxiety and insomnia. She is struggling more with racing thoughts at night.  ? ?Assessment/Plan:  ? ?1. Pre-diabetes ?We will refill Mounjaro at 2.5 mg weekly for 1 month. Patricia Tran will continue to work on weight loss, exercise, and decreasing simple carbohydrates to help decrease the risk of diabetes.  ? ?- tirzepatide (MOUNJARO) 2.5 MG/0.5ML Pen; Inject 2.5 mg into the skin once a week.  Dispense: 2 mL; Refill: 0 ? ?2. Other insomnia ?Patricia Tran agreed to start trazodone 50 mg with no refills, and we will follow up in 1 month at her next visit. ? ?- traZODone (DESYREL) 50 MG tablet; Take 1 tablet (50 mg total) by mouth at bedtime as needed for sleep.  Dispense: 30 tablet; Refill: 0 ? ?3. Obesity BMI today is 27.1 ?Patricia Tran is currently in the action stage of change. As such, her goal is to continue with weight loss efforts. She has agreed to the Category 2 Plan.  ? ?Exercise goals: As is.  ? ?Behavioral modification strategies: increasing lean protein intake and meal planning and cooking strategies. ? ?Patricia Tran has agreed to follow-up with our clinic in 4 to 5 weeks. She was informed of the importance of frequent  follow-up visits to maximize her success with intensive lifestyle modifications for her multiple health conditions.  ? ?Objective:  ? ?Blood pressure (!) 143/78, pulse 84, temperature 97.8 ?F (36.6 ?C), height '5\' 3"'$  (1.6 m), weight 153 lb (69.4 kg), SpO2 99 %. ?Body mass index is 27.1 kg/m?. ? ?General: Cooperative, alert, well developed, in no acute distress. ?HEENT: Conjunctivae and lids unremarkable. ?Cardiovascular: Regular rhythm.  ?Lungs: Normal work of breathing. ?Neurologic: No focal deficits.  ? ?Lab Results  ?Component Value Date  ? CREATININE 0.71 05/06/2021  ? BUN 5 (L) 05/06/2021  ? NA 141 05/06/2021  ? K 3.7 05/06/2021  ? CL 111 05/06/2021  ? CO2 23 05/06/2021  ? ?Lab Results  ?Component Value Date  ? ALT 37 05/04/2021  ? AST 32 05/04/2021  ? ALKPHOS 120 05/04/2021  ? BILITOT 0.5 05/04/2021  ? ?Lab Results  ?Component Value Date  ? HGBA1C 5.5 12/01/2020  ? HGBA1C 5.4 08/11/2020  ? HGBA1C 5.4 10/01/2018  ? ?Lab Results  ?Component Value Date  ? INSULIN 9.0 12/01/2020  ? INSULIN 10.2 08/11/2020  ? ?Lab Results  ?Component Value Date  ? TSH 1.260 03/11/2021  ? ?Lab Results  ?Component Value Date  ? CHOL 194 12/01/2020  ? HDL 38 (L) 12/01/2020  ? LDLCALC 136 (H) 12/01/2020  ? TRIG 109 12/01/2020  ? ?Lab Results  ?Component Value Date  ? VD25OH 57.0 12/01/2020  ? [place]  33.7 08/11/2020  ? ?Lab Results  ?Component Value Date  ? WBC 7.8 05/06/2021  ? HGB 9.9 (L) 05/06/2021  ? HCT 31.6 (L) 05/06/2021  ? MCV 79.0 (L) 05/06/2021  ? PLT 325 05/06/2021  ? ?Lab Results  ?Component Value Date  ? IRON 52 05/05/2021  ? TIBC 349 05/05/2021  ? FERRITIN 7 (L) 05/05/2021  ? ?Attestation Statements:  ? ?Reviewed by clinician on day of visit: allergies, medications, problem list, medical history, surgical history, family history, social history, and previous encounter notes. ? ? ?I, Trixie Dredge, am acting as transcriptionist for Dennard Nip, MD. ? ?I have reviewed the above documentation for accuracy and completeness,  and I agree with the above. -  Dennard Nip, MD ? ? ?

## 2021-08-31 ENCOUNTER — Ambulatory Visit (INDEPENDENT_AMBULATORY_CARE_PROVIDER_SITE_OTHER): Payer: 59 | Admitting: Family Medicine

## 2021-08-31 ENCOUNTER — Other Ambulatory Visit (HOSPITAL_COMMUNITY): Payer: Self-pay

## 2021-08-31 ENCOUNTER — Encounter (INDEPENDENT_AMBULATORY_CARE_PROVIDER_SITE_OTHER): Payer: Self-pay | Admitting: Family Medicine

## 2021-08-31 VITALS — BP 120/80 | HR 96 | Temp 98.2°F | Ht 63.0 in | Wt 156.0 lb

## 2021-08-31 DIAGNOSIS — Z6827 Body mass index (BMI) 27.0-27.9, adult: Secondary | ICD-10-CM | POA: Diagnosis not present

## 2021-08-31 DIAGNOSIS — E559 Vitamin D deficiency, unspecified: Secondary | ICD-10-CM | POA: Diagnosis not present

## 2021-08-31 DIAGNOSIS — E669 Obesity, unspecified: Secondary | ICD-10-CM | POA: Diagnosis not present

## 2021-08-31 DIAGNOSIS — Z9189 Other specified personal risk factors, not elsewhere classified: Secondary | ICD-10-CM

## 2021-08-31 DIAGNOSIS — R7303 Prediabetes: Secondary | ICD-10-CM

## 2021-08-31 MED ORDER — SEMAGLUTIDE-WEIGHT MANAGEMENT 0.25 MG/0.5ML ~~LOC~~ SOAJ
0.2500 mg | SUBCUTANEOUS | 0 refills | Status: DC
  Filled 2021-08-31 – 2021-09-08 (×4): qty 2, 28d supply, fill #0

## 2021-09-01 ENCOUNTER — Other Ambulatory Visit (HOSPITAL_COMMUNITY): Payer: Self-pay

## 2021-09-01 ENCOUNTER — Encounter: Payer: Self-pay | Admitting: Family Medicine

## 2021-09-01 ENCOUNTER — Ambulatory Visit (INDEPENDENT_AMBULATORY_CARE_PROVIDER_SITE_OTHER): Payer: 59 | Admitting: Family Medicine

## 2021-09-01 VITALS — BP 107/70 | HR 97 | Temp 98.6°F | Ht 63.0 in | Wt 159.0 lb

## 2021-09-01 DIAGNOSIS — F330 Major depressive disorder, recurrent, mild: Secondary | ICD-10-CM | POA: Diagnosis not present

## 2021-09-01 DIAGNOSIS — Z0000 Encounter for general adult medical examination without abnormal findings: Secondary | ICD-10-CM

## 2021-09-01 DIAGNOSIS — R7303 Prediabetes: Secondary | ICD-10-CM

## 2021-09-01 DIAGNOSIS — Z1231 Encounter for screening mammogram for malignant neoplasm of breast: Secondary | ICD-10-CM | POA: Diagnosis not present

## 2021-09-01 DIAGNOSIS — E559 Vitamin D deficiency, unspecified: Secondary | ICD-10-CM | POA: Diagnosis not present

## 2021-09-01 DIAGNOSIS — C73 Malignant neoplasm of thyroid gland: Secondary | ICD-10-CM | POA: Diagnosis not present

## 2021-09-01 DIAGNOSIS — Z9009 Acquired absence of other part of head and neck: Secondary | ICD-10-CM

## 2021-09-01 DIAGNOSIS — K222 Esophageal obstruction: Secondary | ICD-10-CM

## 2021-09-01 DIAGNOSIS — E7849 Other hyperlipidemia: Secondary | ICD-10-CM | POA: Diagnosis not present

## 2021-09-01 DIAGNOSIS — F419 Anxiety disorder, unspecified: Secondary | ICD-10-CM

## 2021-09-01 DIAGNOSIS — R9389 Abnormal findings on diagnostic imaging of other specified body structures: Secondary | ICD-10-CM

## 2021-09-01 MED ORDER — MELOXICAM 15 MG PO TABS
15.0000 mg | ORAL_TABLET | Freq: Every day | ORAL | 1 refills | Status: AC
  Filled 2021-09-01 – 2021-09-08 (×2): qty 90, 90d supply, fill #0

## 2021-09-01 MED ORDER — PANTOPRAZOLE SODIUM 40 MG PO TBEC
40.0000 mg | DELAYED_RELEASE_TABLET | Freq: Every day | ORAL | 1 refills | Status: AC
  Filled 2021-09-01: qty 90, 90d supply, fill #0

## 2021-09-01 MED ORDER — BUPROPION HCL ER (XL) 300 MG PO TB24
300.0000 mg | ORAL_TABLET | Freq: Every day | ORAL | 1 refills | Status: AC
Start: 2021-09-01 — End: ?
  Filled 2021-09-01: qty 90, 90d supply, fill #0

## 2021-09-01 MED ORDER — LORAZEPAM 0.5 MG PO TABS
0.5000 mg | ORAL_TABLET | Freq: Every day | ORAL | 1 refills | Status: AC | PRN
  Filled 2021-09-01 – 2021-09-08 (×2): qty 90, 90d supply, fill #0

## 2021-09-01 NOTE — Progress Notes (Signed)
Patient ID: Patricia Tran, female  DOB: 1970/08/29, 51 y.o.   MRN: [id number] Patient Care Team    Relationship Specialty Notifications Start End  Ma Hillock, DO PCP - General Family Medicine  05/11/20   Shamleffer, Melanie Crazier, MD Consulting Physician Endocrinology  08/31/20   Lindon Romp, MD Referring Physician General Surgery  08/31/20     Chief Complaint  Patient presents with   Annual Exam    Pt is not fasting    Subjective: Patricia Tran is a 51 y.o.  Female  present for [place] All past medical history, surgical history, allergies, family history, immunizations, medications and social history were updated in the electronic medical record today. All recent labs, ED visits and hospitalizations within the last year were reviewed.  Health maintenance:  Colonoscopy: no fhx. Completed 11/25/2020,  Dr. Hilarie Fredrickson. 10 yr follow up Mammogram: completed: 09/08/2020, >[facility]> ordered Cervical cancer screening: last pap: 12/2019,referred to gyn Dr. Monica Becton.  Patient's last menstrual period was 08/20/2020. G1P1 Immunizations: tdap utd 08/2021, Influenza UTD(encouraged yearly), zostavax declined, covid series completed Infectious disease screening: HIV completed with pregnancy, Hep C completed today DEXA: consider early screen Assistive device: none Oxygen GMW:NUUV Patient has a Dental home. Hospitalizations/ED visits: reviewed       09/01/2021    2:53 PM 02/15/2021    8:29 AM 08/31/2020    8:22 AM 08/11/2020    8:32 AM  Depression screen PHQ 2/9  Decreased Interest 0 0 0 2  Down, Depressed, Hopeless 1 0 1 3  PHQ - 2 Score 1 0 1 5  Altered sleeping 1 0 2 0  Tired, decreased energy 1 0 1 0  Change in appetite 0 0 1 0  Feeling bad or failure about yourself  0 1 2 0  Trouble concentrating 1 0 0 0  Moving slowly or fidgety/restless 0 0 0 0  Suicidal thoughts 0 0 0 0  PHQ-9 Score '4 1 7 5  '$ Difficult doing work/chores    Not  difficult at all      09/01/2021    2:53 PM 02/15/2021    8:29 AM 08/31/2020    8:22 AM  GAD 7 : Generalized Anxiety Score  Nervous, Anxious, on Edge 1 0 2  Control/stop worrying 1 0 2  Worry too much - different things 0 0 1  Trouble relaxing 0 0 0  Restless 0 0 0  Easily annoyed or irritable 1 0 1  Afraid - awful might happen 0 0 0  Total GAD 7 Score 3 0 6    Immunization History  Administered Date(s) Administered   Influenza Split 04/12/2015, 02/01/2016   Influenza,inj,Quad PF,6+ Mos 02/15/2021   PFIZER Comirnaty(Gray Top)Covid-19 Tri-Sucrose Vaccine 11/07/2019, 11/28/2019, 06/05/2020   PFIZER(Purple Top)SARS-COV-2 Vaccination 11/07/2019, 11/28/2019   Tdap 08/31/2020    Past Medical History:  Diagnosis Date   Allergy    Anemia    Anxiety    Asthma    Constipation    Depression    Diverticulitis    Fibroids 2017   left sided uterine leiomyoma    Gallbladder disease    GERD (gastroesophageal reflux disease)    Goiter    Hyperlipidemia    Joint pain    Kidney stone 08/31/2016   incidental find on CT right renal calculus   Lactose intolerance    SBO (small bowel obstruction) ([place]) 05/04/2021   Thyroid cancer ([place])    Thyroid nodule 03/2013   2.6x1.9x1.8  cm on last US thyroid 02/2015- "stable".  lg mixed schogenic nodule left thyroid. "Follicular lesion of unknown significance"   Allergies  Allergen Reactions   Codeine Itching   Past Surgical History:  Procedure Laterality Date   BIOPSY THYROID  06/2020   CHOLECYSTECTOMY  2011   POST OP ITCHING   TRANSAXILLERY ENDOSCOPIC LEFT THYROID LOBECTOMY WITH ROBOTIC ASSISTANCE Left 08/2020   scheduled end of May 2022.nodule- high concern for malignancy   Family History  Problem Relation Age of Onset   Early death Mother    Kidney disease Mother    Alcohol abuse Father    Cancer Father        oral HPV   Alcoholism Father    Obesity Father    Colitis Father    Colon polyps Father    Diabetes Maternal  Grandmother    Alcohol abuse Maternal Grandfather    Heart disease Maternal Grandfather    Heart attack Paternal Grandmother    Heart attack Paternal Grandfather    Colon cancer Neg Hx    Esophageal cancer Neg Hx    Stomach cancer Neg Hx    Social History   Social History Narrative   Marital status/children/pets: married, 1 child   Education/employment: BA. Building surveyor.    Safety:      -smoke alarm in the home:Yes     - wears seatbelt: Yes     - Feels safe in their relationships: Yes    Allergies as of 09/01/2021       Reactions   Codeine Itching        Medication List        Accurate as of Sep 01, 2021  4:16 PM. If you have any questions, ask your nurse or doctor.          STOP taking these medications    Ferrex 150 150 MG capsule Generic drug: iron polysaccharides Stopped by: Howard Pouch, DO   polyethylene glycol 17 g packet Commonly known as: MIRALAX / GLYCOLAX Stopped by: Howard Pouch, DO   tirzepatide 2.5 MG/0.5ML Pen Commonly known as: MOUNJARO Stopped by: Howard Pouch, DO       TAKE these medications    buPROPion 300 MG 24 hr tablet Commonly known as: WELLBUTRIN XL Take 1 tablet (300 mg total) by mouth daily.   LORazepam 0.5 MG tablet Commonly known as: ATIVAN Take 1 tablet (0.5 mg total) by mouth daily as needed for anxiety.   meloxicam 15 MG tablet Commonly known as: MOBIC Take 1 tablet (15 mg total) by mouth daily.   multivitamin tablet Take 1 tablet by mouth daily.   pantoprazole 40 MG tablet Commonly known as: PROTONIX Take 1 tablet (40 mg total) by mouth daily at 6 (six) AM.   Semaglutide-Weight Management 0.25 MG/0.5ML Soaj Inject 0.25 mg into the skin once a week.   Synthroid 50 MCG tablet Generic drug: levothyroxine Take 1 tablet (50 mcg total) by mouth daily. Take on an empty stomach with a glass of water at least 30-60 minutes before breakfast.   traZODone 50 MG tablet Commonly known as: DESYREL Take 1  tablet (50 mg total) by mouth at bedtime as needed for sleep.   Vitamin D (Ergocalciferol) 1.25 MG (50000 UNIT) Caps capsule Commonly known as: DRISDOL Take 1 capsule (50,000 Units total) by mouth every 7 (seven) days.        All past medical history, surgical history, allergies, family history, immunizations andmedications were updated in the EMR today  and reviewed under the history and medication portions of their EMR.     No results found for this or any previous visit (from the past 2160 hour(s)).  CT ABDOMEN PELVIS W CONTRAST Result Date: 05/04/2021 IMPRESSION: 1. Partial small bowel obstruction with transition in the left upper quadrant. 2. Thickened endometrium (14 mm) for postmenopausal age with either high-density or enhancing material. The patient had a pelvis MRI at [facility] June 2022, recommend gynecology follow-up. 3. Intrahepatic bile duct dilatation focally at the right lobe liver. Recommend follow-up for correlation with preoperative imaging (cholecystectomy). Electronically Signed   By: Jorje Guild M.D.   On: 05/04/2021 08:02   DG Abd Portable 1V Result Date: 05/05/2021 IMPRESSION: Nonobstructive bowel gas pattern. Electronically Signed   By: Suzy Bouchard M.D.   On: 05/05/2021 08:45    ROS 14 pt review of systems performed and negative (unless mentioned in an HPI)  Objective: BP 107/70   Pulse 97   Temp 98.6 F (37 C) (Oral)   Ht '5\' 3"'$  (1.6 m)   Wt 159 lb (72.1 kg)   LMP 08/09/2021   SpO2 100%   BMI 28.17 kg/m  Physical Exam Vitals and nursing note reviewed.  Constitutional:      General: She is not in acute distress.    Appearance: Normal appearance. She is not ill-appearing or toxic-appearing.  HENT:     Head: Normocephalic and atraumatic.     Right Ear: Tympanic membrane, ear canal and external ear normal. There is no impacted cerumen.     Left Ear: Tympanic membrane, ear canal and external ear normal. There is no impacted cerumen.     Nose: No  congestion or rhinorrhea.     Mouth/Throat:     Mouth: Mucous membranes are moist.     Pharynx: Oropharynx is clear. No oropharyngeal exudate or posterior oropharyngeal erythema.  Eyes:     General: No scleral icterus.       Right eye: No discharge.        Left eye: No discharge.     Extraocular Movements: Extraocular movements intact.     Conjunctiva/sclera: Conjunctivae normal.     Pupils: Pupils are equal, round, and reactive to light.  Cardiovascular:     Rate and Rhythm: Normal rate and regular rhythm.     Pulses: Normal pulses.     Heart sounds: Normal heart sounds. No murmur heard.   No friction rub. No gallop.  Pulmonary:     Effort: Pulmonary effort is normal. No respiratory distress.     Breath sounds: Normal breath sounds. No stridor. No wheezing, rhonchi or rales.  Chest:     Chest wall: No tenderness.  Abdominal:     General: Abdomen is flat. Bowel sounds are normal. There is no distension.     Palpations: Abdomen is soft. There is no mass.     Tenderness: There is no abdominal tenderness. There is no right CVA tenderness, left CVA tenderness, guarding or rebound.     Hernia: No hernia is present.  Musculoskeletal:        General: No swelling, tenderness or deformity. Normal range of motion.     Cervical back: Normal range of motion and neck supple. No rigidity or tenderness.     Right lower leg: No edema.     Left lower leg: No edema.  Lymphadenopathy:     Cervical: No cervical adenopathy.  Skin:    General: Skin is warm and dry.  Coloration: Skin is not jaundiced or pale.     Findings: No bruising, erythema, lesion or rash.  Neurological:     General: No focal deficit present.     Mental Status: She is alert and oriented to person, place, and time. Mental status is at baseline.     Cranial Nerves: No cranial nerve deficit.     Sensory: No sensory deficit.     Motor: No weakness.     Coordination: Coordination normal.     Gait: Gait normal.     Deep  Tendon Reflexes: Reflexes normal.  Psychiatric:        Mood and Affect: Mood normal.        Behavior: Behavior normal.        Thought Content: Thought content normal.        Judgment: Judgment normal.      No results found.  Assessment/plan: Laisa Larrick. Tran is a 51 y.o. female present for CPE/CMC hyperlipidemia Stable Diet and exercise controlled - Comprehensive metabolic panel - Lipid panel - TSH - CBC w/Diff  Pre-diabetes - Hemoglobin A1c  Esophageal stenosis Stable Continue protonix 40 mg qd  Thyroid cancer ([place]) s/p thyroidectomy Tsh Continue synthroid 50 mcg qd- will provide refills in appropriate dose after lab result.   Vitamin D deficiency - VITAMIN D 25 Hydroxy (Vit-D Deficiency, Fractures)  Mild episode of recurrent major depressive disorder (HCC)/Anxiety Stable. Continue Wellbutrin 300 mg daily Trazodone managed by weight loss team. [facility] controlled substance database reviewed today. Continue Ativan 0.5 mg daily as needed - Drug Monitoring Panel [phone] , Urine  Thickened endometrium with fibroids  Refilled Mobic 10 mg daily as needed for heavy bleeding  Breast cancer screening by mammogram - MM 3D SCREEN BREAST BILATERAL; Future Routine general medical examination at a health care facility Colonoscopy: no fhx. Completed 11/25/2020,  Dr. Hilarie Fredrickson. 10 yr follow up Mammogram: completed: 09/08/2020, >scheduled at [facility]> ordered Cervical cancer screening: last pap: 12/2019,referred to gyn Dr. Monica Becton.  Patient's last menstrual period was 08/20/2020. G1P1 Immunizations: tdap utd 08/2021, Influenza UTD(encouraged yearly), zostavax declined, covid series completed Infectious disease screening: HIV completed with pregnancy, Hep C completed today DEXA: consider early screen Patient was encouraged to exercise greater than 150 minutes a week. Patient was encouraged to choose a diet filled with fresh fruits and vegetables, and lean  meats. AVS provided to patient today for education/recommendation on gender specific health and safety maintenance.  [place].  Return in about 1 year (around 09/03/2022) for cpe (20 min).  Orders Placed This Encounter  Procedures   MM 3D SCREEN BREAST BILATERAL   VITAMIN D 25 Hydroxy (Vit-D Deficiency, Fractures)   Comprehensive metabolic panel   Hemoglobin A1c   Lipid panel   TSH   Drug Monitoring Panel [phone] , Urine   CBC w/Diff    Meds ordered this encounter  Medications   pantoprazole (PROTONIX) 40 MG tablet    Sig: Take 1 tablet (40 mg total) by mouth daily at 6 (six) AM.    Dispense:  90 tablet    Refill:  1   meloxicam (MOBIC) 15 MG tablet    Sig: Take 1 tablet (15 mg total) by mouth daily.    Dispense:  90 tablet    Refill:  1   LORazepam (ATIVAN) 0.5 MG tablet    Sig: Take 1 tablet (0.5 mg total) by mouth daily as needed for anxiety.    Dispense:  90 tablet    Refill:  1   buPROPion (WELLBUTRIN XL) 300 MG 24 hr tablet    Sig: Take 1 tablet (300 mg total) by mouth daily.    Dispense:  90 tablet    Refill:  1   Referral Orders  No referral(s) requested today    Electronically signed by: Howard Pouch, [place]

## 2021-09-01 NOTE — Patient Instructions (Addendum)

## 2021-09-02 ENCOUNTER — Other Ambulatory Visit (HOSPITAL_COMMUNITY): Payer: Self-pay

## 2021-09-02 ENCOUNTER — Encounter: Payer: 59 | Admitting: Family Medicine

## 2021-09-02 ENCOUNTER — Other Ambulatory Visit (INDEPENDENT_AMBULATORY_CARE_PROVIDER_SITE_OTHER): Payer: Self-pay | Admitting: Family Medicine

## 2021-09-02 ENCOUNTER — Encounter: Payer: Self-pay | Admitting: Family Medicine

## 2021-09-02 ENCOUNTER — Telehealth: Payer: Self-pay | Admitting: Family Medicine

## 2021-09-02 DIAGNOSIS — D500 Iron deficiency anemia secondary to blood loss (chronic): Secondary | ICD-10-CM | POA: Insufficient documentation

## 2021-09-02 DIAGNOSIS — E559 Vitamin D deficiency, unspecified: Secondary | ICD-10-CM

## 2021-09-02 LAB — DRUG MONITORING PANEL 376104, URINE
Amphetamines: NEGATIVE ng/mL (ref <500)
Barbiturates: NEGATIVE ng/mL (ref <300)
Benzodiazepines: NEGATIVE ng/mL (ref <100)
Cocaine Metabolite: NEGATIVE ng/mL (ref <150)
Desmethyltramadol: NEGATIVE ng/mL (ref <100)
Opiates: NEGATIVE ng/mL (ref <100)
Oxycodone: NEGATIVE ng/mL (ref <100)
Tramadol: NEGATIVE ng/mL (ref <100)

## 2021-09-02 LAB — CBC WITH DIFFERENTIAL/PLATELET
Absolute Monocytes: 673 cells/uL (ref 200–950)
Basophils Absolute: 20 cells/uL (ref 0–200)
Basophils Relative: 0.3 %
Eosinophils Absolute: 48 cells/uL (ref 15–500)
Eosinophils Relative: 0.7 %
HCT: 33.8 % — ABNORMAL LOW (ref 35.0–45.0)
Hemoglobin: 10.6 g/dL — ABNORMAL LOW (ref 11.7–15.5)
Lymphs Abs: 1510 cells/uL (ref 850–3900)
MCH: 24.7 pg — ABNORMAL LOW (ref 27.0–33.0)
MCHC: 31.4 g/dL — ABNORMAL LOW (ref 32.0–36.0)
MCV: 78.6 fL — ABNORMAL LOW (ref 80.0–100.0)
MPV: 10 fL (ref 7.5–12.5)
Monocytes Relative: 9.9 %
Neutro Abs: 4549 cells/uL (ref 1500–7800)
Neutrophils Relative %: 66.9 %
Platelets: 362 10*3/uL (ref 140–400)
RBC: 4.3 10*6/uL (ref 3.80–5.10)
RDW: 16.6 % — ABNORMAL HIGH (ref 11.0–15.0)
Total Lymphocyte: 22.2 %
WBC: 6.8 10*3/uL (ref 3.8–10.8)

## 2021-09-02 LAB — LIPID PANEL
Cholesterol: 184 mg/dL (ref <200)
HDL: 44 mg/dL — ABNORMAL LOW (ref >=50)
LDL Cholesterol (Calc): 108 mg/dL (calc) — ABNORMAL HIGH
Non-HDL Cholesterol (Calc): 140 mg/dL (calc) — ABNORMAL HIGH (ref <130)
Total CHOL/HDL Ratio: 4.2 (calc) (ref <5.0)
Triglycerides: 203 mg/dL — ABNORMAL HIGH (ref <150)

## 2021-09-02 LAB — COMPREHENSIVE METABOLIC PANEL
AG Ratio: 1.3 (calc) (ref 1.0–2.5)
ALT: 31 U/L — ABNORMAL HIGH (ref 6–29)
AST: 24 U/L (ref 10–35)
Albumin: 3.6 g/dL (ref 3.6–5.1)
Alkaline phosphatase (APISO): 88 U/L (ref 37–153)
BUN: 16 mg/dL (ref 7–25)
CO2: 25 mmol/L (ref 20–32)
Calcium: 8.4 mg/dL — ABNORMAL LOW (ref 8.6–10.4)
Chloride: 105 mmol/L (ref 98–110)
Creat: 0.78 mg/dL (ref 0.50–1.03)
Globulin: 2.8 g/dL (calc) (ref 1.9–3.7)
Glucose, Bld: 68 mg/dL (ref 65–99)
Potassium: 3.8 mmol/L (ref 3.5–5.3)
Sodium: 139 mmol/L (ref 135–146)
Total Bilirubin: 0.3 mg/dL (ref 0.2–1.2)
Total Protein: 6.4 g/dL (ref 6.1–8.1)

## 2021-09-02 LAB — DM TEMPLATE

## 2021-09-02 LAB — HEMOGLOBIN A1C
Hgb A1c MFr Bld: 5 % of total Hgb (ref <5.7)
Mean Plasma Glucose: 97 mg/dL
eAG (mmol/L): 5.4 mmol/L

## 2021-09-02 LAB — VITAMIN D 25 HYDROXY (VIT D DEFICIENCY, FRACTURES): Vit D, 25-Hydroxy: 82 ng/mL (ref 30–100)

## 2021-09-02 LAB — TSH: TSH: 2.13 mIU/L

## 2021-09-02 MED ORDER — LEVOTHYROXINE SODIUM 50 MCG PO TABS
50.0000 ug | ORAL_TABLET | Freq: Every day | ORAL | 4 refills | Status: AC
Start: 2021-09-02 — End: ?
  Filled 2021-09-02: qty 90, 90d supply, fill #0

## 2021-09-02 NOTE — Telephone Encounter (Signed)
Please call patient kidney and thyroid function are normal Blood cell counts and electrolytes are normal Diabetes screening/A1c is normal at 5.0 Cholesterol panel looks   Calcium is low and has remained low since her partial thyroidectomy.  This is usually from injury to the parathyroid glands during surgical procedure which is common.  Typically this type of low calcium is temporary and recovers on its own, unless permanent damage or accidental removal of parathyroid gland occurred in surgery.   Her calcium has slowly started to improve over the last 5 months, but still shy of normal now 8.4> Goal 8.8-9. Since she is lactose intolerant, I would recommend she add 2 Tums daily to her supplements.   AST is one of the liver enzymes and is just barely elevated at 31.  This is not concerning at this level.  Her hemoglobin and hematocrit are low at 10.6.  This is likely from her heavy menstrual bleeding.  I have placed a lab for iron panel for her to have collected asap for lab appt only. I will likely recommend an iron supplement, based on that result.    I have refilled her Wellbutrin, Synthroid, Ativan, Mobic, Protonix for her.  All of the prescriptions can be transferred between her pharmacies when she moves, with the exception of the Ativan.

## 2021-09-02 NOTE — Telephone Encounter (Signed)
LM for pt to return call to discuss.  

## 2021-09-03 ENCOUNTER — Other Ambulatory Visit (HOSPITAL_COMMUNITY): Payer: Self-pay

## 2021-09-03 ENCOUNTER — Encounter (INDEPENDENT_AMBULATORY_CARE_PROVIDER_SITE_OTHER): Payer: Self-pay | Admitting: Family Medicine

## 2021-09-03 NOTE — Telephone Encounter (Signed)
Please advise 

## 2021-09-03 NOTE — Telephone Encounter (Signed)
Patient should contact her specialty team we can forward them the result.

## 2021-09-03 NOTE — Telephone Encounter (Signed)
Spoke with pt regarding labs and instructions.   

## 2021-09-07 ENCOUNTER — Ambulatory Visit (INDEPENDENT_AMBULATORY_CARE_PROVIDER_SITE_OTHER): Payer: 59

## 2021-09-07 ENCOUNTER — Telehealth (INDEPENDENT_AMBULATORY_CARE_PROVIDER_SITE_OTHER): Payer: Self-pay | Admitting: Family Medicine

## 2021-09-07 ENCOUNTER — Other Ambulatory Visit (HOSPITAL_COMMUNITY): Payer: Self-pay

## 2021-09-07 ENCOUNTER — Other Ambulatory Visit (INDEPENDENT_AMBULATORY_CARE_PROVIDER_SITE_OTHER): Payer: Self-pay | Admitting: Family Medicine

## 2021-09-07 ENCOUNTER — Encounter (INDEPENDENT_AMBULATORY_CARE_PROVIDER_SITE_OTHER): Payer: Self-pay

## 2021-09-07 DIAGNOSIS — D500 Iron deficiency anemia secondary to blood loss (chronic): Secondary | ICD-10-CM | POA: Diagnosis not present

## 2021-09-07 DIAGNOSIS — E559 Vitamin D deficiency, unspecified: Secondary | ICD-10-CM

## 2021-09-07 LAB — IBC + FERRITIN
Ferritin: 4 ng/mL — ABNORMAL LOW (ref 10.0–291.0)
Iron: 24 ug/dL — ABNORMAL LOW (ref 42–145)
Saturation Ratios: 5.4 % — ABNORMAL LOW (ref 20.0–50.0)
TIBC: 443.8 ug/dL (ref 250.0–450.0)
Transferrin: 317 mg/dL (ref 212.0–360.0)

## 2021-09-07 NOTE — Progress Notes (Signed)
Per the orders of  Dr.Kuneff pt is here for labs, pt tolerated draw well.

## 2021-09-07 NOTE — Telephone Encounter (Signed)
Dr. Leafy Ro - Prior authorization approved for [facility]. Effective: 09/04/2021-03/26/2022. Patient sent approval message via mychart.

## 2021-09-08 ENCOUNTER — Other Ambulatory Visit (HOSPITAL_COMMUNITY): Payer: Self-pay

## 2021-09-08 ENCOUNTER — Telehealth: Payer: Self-pay | Admitting: Family Medicine

## 2021-09-08 MED ORDER — POLYSACCHARIDE IRON COMPLEX 150 MG PO CAPS
150.0000 mg | ORAL_CAPSULE | Freq: Every day | ORAL | 3 refills | Status: AC
  Filled 2021-09-08: qty 90, 90d supply, fill #0

## 2021-09-08 MED ORDER — VITAMIN D (ERGOCALCIFEROL) 1.25 MG (50000 UNIT) PO CAPS
50000.0000 [IU] | ORAL_CAPSULE | ORAL | 0 refills | Status: DC
  Filled 2021-09-08: qty 4, 28d supply, fill #0

## 2021-09-08 NOTE — Telephone Encounter (Signed)
Please inform patient The iron levels in her blood are significantly low at 24 (goal at least over 50 (Her iron stores/ferritin is significantly low at 4 (ideally at least in the 20s). I have called in oral supplement, I will call normal polysaccharide 150 mg daily dosing.  I would encourage her to start this supplement daily and be cautious of potential constipation side effects that can come with oral iron.  May need to use a stool softener with use of iron.  I would also encourage her to talk to her gynecological provider about the iron deficiency anemia which is likely secondary to her menstrual cycles.  If insurance does not cover iron supplement called in, it can be purchased over-the-counter or [employer]

## 2021-09-08 NOTE — Telephone Encounter (Signed)
Spoke with patient regarding results/recommendations.  

## 2021-09-08 NOTE — Telephone Encounter (Signed)
Please see message in chart.

## 2021-09-10 ENCOUNTER — Ambulatory Visit
Admission: RE | Admit: 2021-09-10 | Discharge: 2021-09-10 | Disposition: A | Discharge status: Home / Self Care | Payer: 59 | Source: Ambulatory Visit | Attending: Family Medicine | Admitting: Family Medicine

## 2021-09-10 DIAGNOSIS — Z1231 Encounter for screening mammogram for malignant neoplasm of breast: Secondary | ICD-10-CM

## 2021-09-13 ENCOUNTER — Telehealth: Payer: 59 | Admitting: Physician Assistant

## 2021-09-13 ENCOUNTER — Other Ambulatory Visit (HOSPITAL_COMMUNITY): Payer: Self-pay

## 2021-09-13 DIAGNOSIS — R3989 Other symptoms and signs involving the genitourinary system: Secondary | ICD-10-CM | POA: Diagnosis not present

## 2021-09-13 MED ORDER — CEPHALEXIN 500 MG PO CAPS
500.0000 mg | ORAL_CAPSULE | Freq: Two times a day (BID) | ORAL | 0 refills | Status: DC
  Filled 2021-09-13: qty 14, 7d supply, fill #0

## 2021-09-13 MED ORDER — PHENAZOPYRIDINE HCL 100 MG PO TABS
100.0000 mg | ORAL_TABLET | Freq: Three times a day (TID) | ORAL | 0 refills | Status: DC | PRN
  Filled 2021-09-13: qty 10, 4d supply, fill #0

## 2021-09-13 NOTE — Addendum Note (Signed)
Addended by: Mar Daring on: 09/13/2021 07:23 AM   Modules accepted: Orders

## 2021-09-13 NOTE — Progress Notes (Signed)
Chief Complaint:   OBESITY Patricia Tran is here to discuss her progress with her obesity treatment plan along with follow-up of her obesity related diagnoses. Patricia Tran is on the Category 2 Plan and states she is following her eating plan approximately 50% of the time. Patricia Tran states she is swimming for 30 minutes 1 time per week.  Today's visit was #: 18 Starting weight: 185 lbs Starting date: 5/3/20212 Today's weight: 156 lbs Today's date: 08/31/2021 Total lbs lost to date: 29 Total lbs lost since last in-office visit: 0  Interim History: Patricia Tran went on vacation and she did some celebration eating, but she concentrated on eating lean protein. She was mindful of her food choices overall. She was very active on her cruise and did a lot of walking.  Subjective:   1. Pre-diabetes Patricia Tran is unable to get [facility]. She has done well with GLP-1 in the past. She continues to work on her diet and exercise.   2. Vitamin D deficiency Patricia Tran is on Vitamin D, with no side effects noted.   3. At risk for diabetes mellitus Patricia Tran is at higher than average risk for developing diabetes due to her obesity.  Assessment/Plan:   1. Pre-diabetes Patricia Tran will continue her diet and exercise, and she will restart GLP-1 for weight loss and we will follow up at her next visit.   2. Vitamin D deficiency Patricia Tran will continue her Vitamin D, and she will follow-up for routine testing of Vitamin D, at least 2-3 times per year to avoid over-replacement.  3. At risk for diabetes mellitus Patricia Tran was given approximately 15 minutes of diabetic education and counseling today. We discussed intensive lifestyle modifications today with an emphasis on weight loss as well as increasing exercise and decreasing simple carbohydrates in her diet. We also reviewed medication options with an emphasis on risk versus benefits of those discussed.  Repetitive spaced learning was employed today to elicit superior memory formation and behavioral  change.  4. Obesity, Current BMI 27.7 Patricia Tran is currently in the action stage of change. As such, her goal is to continue with weight loss efforts. She has agreed to the Category 2 Plan.   \We discussed various medication options to help Patricia Tran with her weight loss efforts and we both agreed to start Wegovy 0.25 mg q week with no refills.  - Semaglutide-Weight Management 0.25 MG/0.5ML SOAJ; Inject 0.25 mg into the skin once a week.  Dispense: 2 mL; Refill: 0  Exercise goals: As is.   Behavioral modification strategies: increasing lean protein intake and meal planning and cooking strategies.  Patricia Tran has agreed to follow-up with our clinic in 4 weeks. She was informed of the importance of frequent follow-up visits to maximize her success with intensive lifestyle modifications for her multiple health conditions.   Objective:   Blood pressure 120/80, pulse 96, temperature 98.2 F (36.8 C), height '5\' 3"'$  (1.6 m), weight 156 lb (70.8 kg), last menstrual period 08/09/2021, SpO2 99 %. Body mass index is 27.63 kg/m.  General: Cooperative, alert, well developed, in no acute distress. HEENT: Conjunctivae and lids unremarkable. Cardiovascular: Regular rhythm.  Lungs: Normal work of breathing. Neurologic: No focal deficits.   Lab Results  Component Value Date   CREATININE 0.78 09/01/2021   BUN 16 09/01/2021   NA 139 09/01/2021   K 3.8 09/01/2021   CL 105 09/01/2021   CO2 25 09/01/2021   Lab Results  Component Value Date   ALT 31 (H) 09/01/2021   AST 24  09/01/2021   ALKPHOS 120 05/04/2021   BILITOT 0.3 09/01/2021   Lab Results  Component Value Date   HGBA1C 5.0 09/01/2021   HGBA1C 5.5 12/01/2020   HGBA1C 5.4 08/11/2020   HGBA1C 5.4 10/01/2018   Lab Results  Component Value Date   INSULIN 9.0 12/01/2020   INSULIN 10.2 08/11/2020   Lab Results  Component Value Date   TSH 2.13 09/01/2021   Lab Results  Component Value Date   CHOL 184 09/01/2021   HDL 44 (L) 09/01/2021    LDLCALC 108 (H) 09/01/2021   TRIG 203 (H) 09/01/2021   CHOLHDL 4.2 09/01/2021   Lab Results  Component Value Date   VD25OH 82 09/01/2021   VD25OH 57.0 12/01/2020   VD25OH 33.7 08/11/2020   Lab Results  Component Value Date   WBC 6.8 09/01/2021   HGB 10.6 (L) 09/01/2021   HCT 33.8 (L) 09/01/2021   MCV 78.6 (L) 09/01/2021   PLT 362 09/01/2021   Lab Results  Component Value Date   IRON 24 (L) 09/07/2021   TIBC 443.8 09/07/2021   FERRITIN 4.0 (L) 09/07/2021   Attestation Statements:   Reviewed by clinician on day of visit: allergies, medications, problem list, medical history, surgical history, family history, social history, and previous encounter notes.   I, Trixie Dredge, am acting as transcriptionist for Dennard Nip, MD.  I have reviewed the above documentation for accuracy and completeness, and I agree with the above. -  Dennard Nip, MD

## 2021-09-13 NOTE — Progress Notes (Signed)

## 2021-09-20 NOTE — Telephone Encounter (Signed)
Yes please

## 2021-09-28 ENCOUNTER — Telehealth (INDEPENDENT_AMBULATORY_CARE_PROVIDER_SITE_OTHER): Payer: 59 | Admitting: Family Medicine

## 2021-09-28 ENCOUNTER — Encounter (INDEPENDENT_AMBULATORY_CARE_PROVIDER_SITE_OTHER): Payer: Self-pay | Admitting: *Deleted

## 2021-09-28 DIAGNOSIS — F439 Reaction to severe stress, unspecified: Secondary | ICD-10-CM | POA: Diagnosis not present

## 2021-09-28 DIAGNOSIS — R632 Polyphagia: Secondary | ICD-10-CM | POA: Diagnosis not present

## 2021-09-28 DIAGNOSIS — Z6827 Body mass index (BMI) 27.0-27.9, adult: Secondary | ICD-10-CM | POA: Diagnosis not present

## 2021-09-28 DIAGNOSIS — E669 Obesity, unspecified: Secondary | ICD-10-CM

## 2021-09-28 MED ORDER — BD PEN NEEDLE NANO U/F 32G X 4 MM MISC: 1.0000 [IU] | 0 refills | Status: AC

## 2021-09-28 MED ORDER — SAXENDA 18 MG/3ML ~~LOC~~ SOPN: 3.0000 mg | PEN_INJECTOR | Freq: Every day | SUBCUTANEOUS | 0 refills | Status: DC

## 2021-09-29 NOTE — Progress Notes (Signed)
TeleHealth Visit:  Due to the COVID-19 pandemic, this visit was completed with telemedicine (audio/video) technology to reduce patient and provider exposure as well as to preserve personal protective equipment.   Raghad has verbally consented to this TeleHealth visit. The patient is located at home, the provider is located at the [facility] office. The participants in this visit include the listed provider and patient. The visit was conducted today via MyChart video.   Chief Complaint: OBESITY Maudene is here to discuss her progress with her obesity treatment plan along with follow-up of her obesity related diagnoses. Tarisa is on the Category 2 Plan and states she is following her eating plan approximately 40% of the time. Kylar states she is doing 0 minutes 0 times per week.  Today's visit was #: 19 Starting weight: 185 lbs Starting date: 08/12/2019  Interim History: Mersedes is in the process of moving and she is unable to concentrate on weight loss.  She would like medicine to help her with weight loss and she is open to trying Saxenda.  Subjective:   1. Stress Yaira is in the process of moving and she notes increased irritability and short tempered.  She feels this is temporary and she is not overly concerned.  2. Polyphagia Axelle is unable to get [facility] due to pharmacy shortages.  She is struggling to decrease her calories.  Assessment/Plan:   1. Stress Vernadine was offered support and encouragement.  2. Polyphagia Byanca will start [place] and we will follow-up at her next visit.  3. Obesity, Current BMI 27.7 Taylyn is currently in the action stage of change. As such, her goal is to continue with weight loss efforts. She has agreed to the Category 2 Plan.   We discussed various medication options to help Fumiye with her weight loss efforts and we both agreed to start Saxenda 3.0 mg (patient to take at 0.6 mg for now); and Nano needles #100 with no refills.  - Liraglutide  -Weight Management (SAXENDA) 18 MG/3ML SOPN; Inject 3 mg into the skin daily.  Dispense: 15 mL; Refill: 0 - Insulin Pen Needle (BD PEN NEEDLE NANO U/F) 32G X 4 MM MISC; 1 Units by Does not apply route 1 day or 1 dose for 1 dose.  Dispense: 100 each; Refill: 0  Behavioral modification strategies: decreasing eating out and meal planning and cooking strategies.  Avon has agreed to follow-up with our clinic in 3 weeks. She was informed of the importance of frequent follow-up visits to maximize her success with intensive lifestyle modifications for her multiple health conditions.  Objective:   VITALS: Per patient if applicable, see vitals. GENERAL: Alert and in no acute distress. CARDIOPULMONARY: No increased WOB. Speaking in clear sentences.  PSYCH: Pleasant and cooperative. Speech normal rate and rhythm. Affect is appropriate. Insight and judgement are appropriate. Attention is focused, linear, and appropriate.  NEURO: Oriented as arrived to appointment on time with no prompting.   Lab Results  Component Value Date   CREATININE 0.78 09/01/2021   BUN 16 09/01/2021   NA 139 09/01/2021   K 3.8 09/01/2021   CL 105 09/01/2021   CO2 25 09/01/2021   Lab Results  Component Value Date   ALT 31 (H) 09/01/2021   AST 24 09/01/2021   ALKPHOS 120 05/04/2021   BILITOT 0.3 09/01/2021   Lab Results  Component Value Date   HGBA1C 5.0 09/01/2021   HGBA1C 5.5 12/01/2020   HGBA1C 5.4 08/11/2020   HGBA1C 5.4  10/01/2018   Lab Results  Component Value Date   INSULIN 9.0 12/01/2020   INSULIN 10.2 08/11/2020   Lab Results  Component Value Date   TSH 2.13 09/01/2021   Lab Results  Component Value Date   CHOL 184 09/01/2021   HDL 44 (L) 09/01/2021   LDLCALC 108 (H) 09/01/2021   TRIG 203 (H) 09/01/2021   CHOLHDL 4.2 09/01/2021   Lab Results  Component Value Date   VD25OH 82 09/01/2021   VD25OH 57.0 12/01/2020   VD25OH 33.7 08/11/2020   Lab Results  Component Value Date   WBC 6.8  09/01/2021   HGB 10.6 (L) 09/01/2021   HCT 33.8 (L) 09/01/2021   MCV 78.6 (L) 09/01/2021   PLT 362 09/01/2021   Lab Results  Component Value Date   IRON 24 (L) 09/07/2021   TIBC 443.8 09/07/2021   FERRITIN 4.0 (L) 09/07/2021    Attestation Statements:   Reviewed by clinician on day of visit: allergies, medications, problem list, medical history, surgical history, family history, social history, and previous encounter notes.   I, Trixie Dredge, am acting as transcriptionist for Dennard Nip, MD.  I have reviewed the above documentation for accuracy and completeness, and I agree with the above. - Dennard Nip, MD

## 2021-09-30 ENCOUNTER — Other Ambulatory Visit (HOSPITAL_COMMUNITY): Payer: Self-pay

## 2021-10-04 ENCOUNTER — Telehealth (INDEPENDENT_AMBULATORY_CARE_PROVIDER_SITE_OTHER): Payer: Self-pay | Admitting: Family Medicine

## 2021-10-04 ENCOUNTER — Encounter (INDEPENDENT_AMBULATORY_CARE_PROVIDER_SITE_OTHER): Payer: Self-pay

## 2021-10-19 ENCOUNTER — Telehealth (INDEPENDENT_AMBULATORY_CARE_PROVIDER_SITE_OTHER): Payer: Self-pay | Admitting: *Deleted

## 2021-10-19 ENCOUNTER — Telehealth (INDEPENDENT_AMBULATORY_CARE_PROVIDER_SITE_OTHER): Payer: 59 | Admitting: Family Medicine

## 2021-10-19 ENCOUNTER — Encounter (INDEPENDENT_AMBULATORY_CARE_PROVIDER_SITE_OTHER): Payer: Self-pay | Admitting: Family Medicine

## 2021-10-19 VITALS — Ht 63.0 in | Wt 154.0 lb

## 2021-10-19 DIAGNOSIS — R7303 Prediabetes: Secondary | ICD-10-CM | POA: Diagnosis not present

## 2021-10-19 DIAGNOSIS — Z6827 Body mass index (BMI) 27.0-27.9, adult: Secondary | ICD-10-CM | POA: Diagnosis not present

## 2021-10-19 DIAGNOSIS — E669 Obesity, unspecified: Secondary | ICD-10-CM | POA: Diagnosis not present

## 2021-10-19 NOTE — Telephone Encounter (Signed)
3:03 Left message for patient to call back 3:14 Left message for patient to call back 3:26 Spoke with patient, she was informed she would be seen after provider finished seeing the last in office patient.

## 2021-10-20 NOTE — Progress Notes (Signed)
TeleHealth Visit:  Due to the COVID-19 pandemic, this visit was completed with telemedicine (audio/video) technology to reduce patient and provider exposure as well as to preserve personal protective equipment.   Patricia Tran has verbally consented to this TeleHealth visit. The patient is located at home, the provider is located at the [facility] office. The participants in this visit include the listed provider and patient. The visit was conducted today via MyChart video.   Chief Complaint: OBESITY Patricia Tran is here to discuss her progress with her obesity treatment plan along with follow-up of her obesity related diagnoses. Patricia Tran is on the Category 2 Plan and states she is following her eating plan approximately 50% of the time. Patricia Tran states she is doing 0 minutes 0 times per week.  Today's visit was #: 20 Starting weight: 185 lbs Starting date: 08/12/2019  Interim History: Patricia Tran continues to do well with weight loss.  She is on Saxenda and has increased her dose to 1.8 mg now, but it is still not helping her control her appetite as much as Mounjaro.  Subjective:   1. Pre-diabetes Patricia Tran is struggling to follow her eating plan due to increased polyphagia.  She is on Saxenda, but she is still struggling.  She is doing well with weight loss however.  Assessment/Plan:   1. Pre-diabetes Patricia Tran will continue to work on her diet and exercise, and increase her GLP-1 to 2.4 mg. We will continue to monitor.  2. Obesity, Current BMI 27.28 Patricia Tran is currently in the action stage of change. As such, her goal is to continue with weight loss efforts. She has agreed to the Category 2 Plan.   Patricia Tran is to increase Saxenda to 2.4 mg once daily, and continue to work on following her category 2 plan.  Behavioral modification strategies: increasing lean protein intake.  Patricia Tran has agreed to follow-up with our clinic in 4 weeks. She was informed of the importance of frequent follow-up visits to maximize her  success with intensive lifestyle modifications for her multiple health conditions.  Objective:   VITALS: Per patient if applicable, see vitals. GENERAL: Alert and in no acute distress. CARDIOPULMONARY: No increased WOB. Speaking in clear sentences.  PSYCH: Pleasant and cooperative. Speech normal rate and rhythm. Affect is appropriate. Insight and judgement are appropriate. Attention is focused, linear, and appropriate.  NEURO: Oriented as arrived to appointment on time with no prompting.   Lab Results  Component Value Date   CREATININE 0.78 09/01/2021   BUN 16 09/01/2021   NA 139 09/01/2021   K 3.8 09/01/2021   CL 105 09/01/2021   CO2 25 09/01/2021   Lab Results  Component Value Date   ALT 31 (H) 09/01/2021   AST 24 09/01/2021   ALKPHOS 120 05/04/2021   BILITOT 0.3 09/01/2021   Lab Results  Component Value Date   HGBA1C 5.0 09/01/2021   HGBA1C 5.5 12/01/2020   HGBA1C 5.4 08/11/2020   HGBA1C 5.4 10/01/2018   Lab Results  Component Value Date   INSULIN 9.0 12/01/2020   INSULIN 10.2 08/11/2020   Lab Results  Component Value Date   TSH 2.13 09/01/2021   Lab Results  Component Value Date   CHOL 184 09/01/2021   HDL 44 (L) 09/01/2021   LDLCALC 108 (H) 09/01/2021   TRIG 203 (H) 09/01/2021   CHOLHDL 4.2 09/01/2021   Lab Results  Component Value Date   VD25OH 82 09/01/2021   VD25OH 57.0 12/01/2020   VD25OH 33.7 08/11/2020  Lab Results  Component Value Date   WBC 6.8 09/01/2021   HGB 10.6 (L) 09/01/2021   HCT 33.8 (L) 09/01/2021   MCV 78.6 (L) 09/01/2021   PLT 362 09/01/2021   Lab Results  Component Value Date   IRON 24 (L) 09/07/2021   TIBC 443.8 09/07/2021   FERRITIN 4.0 (L) 09/07/2021    Attestation Statements:   Reviewed by clinician on day of visit: allergies, medications, problem list, medical history, surgical history, family history, social history, and previous encounter notes.   I, Trixie Dredge, am acting as transcriptionist for Dennard Nip, MD.  I have reviewed the above documentation for accuracy and completeness, and I agree with the above. - Dennard Nip, MD

## 2021-11-08 ENCOUNTER — Encounter: Payer: Self-pay | Admitting: Family Medicine

## 2021-11-08 ENCOUNTER — Other Ambulatory Visit (HOSPITAL_COMMUNITY): Payer: Self-pay

## 2021-11-08 ENCOUNTER — Encounter (INDEPENDENT_AMBULATORY_CARE_PROVIDER_SITE_OTHER): Payer: Self-pay | Admitting: Family Medicine

## 2021-11-08 ENCOUNTER — Telehealth (INDEPENDENT_AMBULATORY_CARE_PROVIDER_SITE_OTHER): Payer: 59 | Admitting: Family Medicine

## 2021-11-08 DIAGNOSIS — E669 Obesity, unspecified: Secondary | ICD-10-CM | POA: Diagnosis not present

## 2021-11-08 DIAGNOSIS — Z6827 Body mass index (BMI) 27.0-27.9, adult: Secondary | ICD-10-CM | POA: Diagnosis not present

## 2021-11-08 DIAGNOSIS — E559 Vitamin D deficiency, unspecified: Secondary | ICD-10-CM | POA: Diagnosis not present

## 2021-11-08 DIAGNOSIS — R7303 Prediabetes: Secondary | ICD-10-CM | POA: Diagnosis not present

## 2021-11-08 DIAGNOSIS — Z7985 Long-term (current) use of injectable non-insulin antidiabetic drugs: Secondary | ICD-10-CM

## 2021-11-08 MED ORDER — VITAMIN D (ERGOCALCIFEROL) 1.25 MG (50000 UNIT) PO CAPS: 50000.0000 [IU] | ORAL_CAPSULE | ORAL | 0 refills | Status: DC

## 2021-11-08 MED ORDER — TIRZEPATIDE 2.5 MG/0.5ML ~~LOC~~ SOAJ: 2.5000 mg | SUBCUTANEOUS | 0 refills | Status: DC

## 2021-11-09 ENCOUNTER — Encounter (INDEPENDENT_AMBULATORY_CARE_PROVIDER_SITE_OTHER): Payer: Self-pay

## 2021-11-09 NOTE — Telephone Encounter (Signed)
Please advise if med needs to be DAW

## 2021-11-09 NOTE — Telephone Encounter (Signed)
Please advise patient that the Niferex-150 and the Ferrex 150 are the same medication-different manufacturer.   Both are the iron polysaccharide 150 mg.     FYI: Patient had told me she was moving to [place] after her last appointment.

## 2021-11-10 ENCOUNTER — Encounter (INDEPENDENT_AMBULATORY_CARE_PROVIDER_SITE_OTHER): Payer: Self-pay | Admitting: Family Medicine

## 2021-11-10 ENCOUNTER — Encounter (INDEPENDENT_AMBULATORY_CARE_PROVIDER_SITE_OTHER): Payer: Self-pay

## 2021-11-10 ENCOUNTER — Telehealth (INDEPENDENT_AMBULATORY_CARE_PROVIDER_SITE_OTHER): Payer: Self-pay | Admitting: Family Medicine

## 2021-11-10 DIAGNOSIS — R632 Polyphagia: Secondary | ICD-10-CM

## 2021-11-10 DIAGNOSIS — R7303 Prediabetes: Secondary | ICD-10-CM

## 2021-11-10 NOTE — Telephone Encounter (Signed)
Which insurance was prior authorization for? Thanks

## 2021-11-10 NOTE — Telephone Encounter (Signed)
Please see message in chart. Patient provided me with new insurance information yesterday, as I requested. Thanks!

## 2021-11-10 NOTE — Telephone Encounter (Signed)
Please advise 

## 2021-11-10 NOTE — Telephone Encounter (Signed)
Dr. Leafy Ro - Prior authorization denied for [facility]. Per insurance: Patient does not have type 2 diabetes. Patient sent denial message via mychart.

## 2021-11-16 NOTE — Telephone Encounter (Signed)
Error

## 2021-11-16 NOTE — Progress Notes (Unsigned)
TeleHealth Visit:  Due to the COVID-19 pandemic, this visit was completed with telemedicine (audio/video) technology to reduce patient and provider exposure as well as to preserve personal protective equipment.   Patricia Tran has verbally consented to this TeleHealth visit. The patient is located at home, the provider is located at the [facility] office. The participants in this visit include the listed provider and patient. The visit was conducted today via MyChart video.   Chief Complaint: OBESITY Patricia Tran is here to discuss her progress with her obesity treatment plan along with follow-up of her obesity related diagnoses. Patricia Tran is on the Category 2 Plan and states she is following her eating plan approximately 60% of the time. Patricia Tran states she is doing 0 minutes 0 times per week.  Today's visit was #: 21 Starting weight: 185 lbs Starting date: 08/12/2019  Interim History: Patricia Tran feels she is doing better with Saxenda at 2.4 mg.  Her hunger is controlled, but she sometimes misses some doses on a daily dose.  She reports her weight at home is 152 pounds.  Subjective:   1. Vitamin D deficiency Patricia Tran is stable on vitamin D, and she requested a refill.  No side effects were noted.  2. Pre-diabetes Patricia Tran has a new insurance and would like to try to restart Mounjaro.  She notes polyphagia.  Assessment/Plan:   1. Vitamin D deficiency [name] will continue prescription Vitamin D 50,000 IU every week, and we will refill for 90 days. She will follow-up for routine testing of Vitamin D, at least 2-3 times per year to avoid over-replacement.  - Vitamin D, Ergocalciferol, (DRISDOL) 1.25 MG (50000 UNIT) CAPS capsule; Take 1 capsule (50,000 Units total) by mouth every 7 (seven) days.  Dispense: 12 capsule; Refill: 0  2. Pre-diabetes Patricia Tran agreed to start Mounjaro 2.5 mg once weekly with a 90 day supply, with no refill.   - tirzepatide (MOUNJARO) 2.5 MG/0.5ML Pen; Inject 2.5 mg into the skin once a  week.  Dispense: 6 mL; Refill: 0  3. Obesity, Current BMI 27.28 Patricia Tran is currently in the action stage of change. As such, her goal is to continue with weight loss efforts. She has agreed to the Category 2 Plan.   Behavioral modification strategies: increasing lean protein intake.  Patricia Tran has agreed to follow-up with our clinic in 4 weeks via virtual visit. She was informed of the importance of frequent follow-up visits to maximize her success with intensive lifestyle modifications for her multiple health conditions.  Objective:   VITALS: Per patient if applicable, see vitals. GENERAL: Alert and in no acute distress. CARDIOPULMONARY: No increased WOB. Speaking in clear sentences.  PSYCH: Pleasant and cooperative. Speech normal rate and rhythm. Affect is appropriate. Insight and judgement are appropriate. Attention is focused, linear, and appropriate.  NEURO: Oriented as arrived to appointment on time with no prompting.   Lab Results  Component Value Date   CREATININE 0.78 09/01/2021   BUN 16 09/01/2021   NA 139 09/01/2021   K 3.8 09/01/2021   CL 105 09/01/2021   CO2 25 09/01/2021   Lab Results  Component Value Date   ALT 31 (H) 09/01/2021   AST 24 09/01/2021   ALKPHOS 120 05/04/2021   BILITOT 0.3 09/01/2021   Lab Results  Component Value Date   HGBA1C 5.0 09/01/2021   HGBA1C 5.5 12/01/2020   HGBA1C 5.4 08/11/2020   HGBA1C 5.4 10/01/2018   Lab Results  Component Value Date   INSULIN 9.0 12/01/2020  INSULIN 10.2 08/11/2020   Lab Results  Component Value Date   TSH 2.13 09/01/2021   Lab Results  Component Value Date   CHOL 184 09/01/2021   HDL 44 (L) 09/01/2021   LDLCALC 108 (H) 09/01/2021   TRIG 203 (H) 09/01/2021   CHOLHDL 4.2 09/01/2021   Lab Results  Component Value Date   VD25OH 82 09/01/2021   VD25OH 57.0 12/01/2020   VD25OH 33.7 08/11/2020   Lab Results  Component Value Date   WBC 6.8 09/01/2021   HGB 10.6 (L) 09/01/2021   HCT 33.8 (L) 09/01/2021    MCV 78.6 (L) 09/01/2021   PLT 362 09/01/2021   Lab Results  Component Value Date   IRON 24 (L) 09/07/2021   TIBC 443.8 09/07/2021   FERRITIN 4.0 (L) 09/07/2021    Attestation Statements:   Reviewed by clinician on day of visit: allergies, medications, problem list, medical history, surgical history, family history, social history, and previous encounter notes.   I, Trixie Dredge, am acting as transcriptionist for Dennard Nip, MD.  I have reviewed the above documentation for accuracy and completeness, and I agree with the above. - Dennard Nip, MD

## 2021-11-17 ENCOUNTER — Encounter (INDEPENDENT_AMBULATORY_CARE_PROVIDER_SITE_OTHER): Payer: Self-pay

## 2021-11-18 MED ORDER — BD PEN NEEDLE NANO U/F 32G X 4 MM MISC: 1.0000 [IU] | 0 refills | Status: AC

## 2021-11-18 MED ORDER — SAXENDA 18 MG/3ML ~~LOC~~ SOPN: 3.0000 mg | PEN_INJECTOR | Freq: Every day | SUBCUTANEOUS | 0 refills | Status: DC

## 2021-11-18 NOTE — Telephone Encounter (Signed)
Please advise 

## 2021-11-18 NOTE — Telephone Encounter (Signed)
Please refill saxenda 90 days, wegovy not available yet

## 2021-11-18 NOTE — Telephone Encounter (Signed)
Please review

## 2021-11-22 ENCOUNTER — Encounter: Payer: Self-pay | Admitting: Family Medicine

## 2021-11-22 ENCOUNTER — Other Ambulatory Visit (HOSPITAL_COMMUNITY): Payer: Self-pay

## 2021-11-23 ENCOUNTER — Telehealth (INDEPENDENT_AMBULATORY_CARE_PROVIDER_SITE_OTHER): Payer: Self-pay | Admitting: Family Medicine

## 2021-11-23 ENCOUNTER — Encounter (INDEPENDENT_AMBULATORY_CARE_PROVIDER_SITE_OTHER): Payer: Self-pay

## 2021-11-23 ENCOUNTER — Other Ambulatory Visit (HOSPITAL_COMMUNITY): Payer: Self-pay

## 2021-11-23 NOTE — Telephone Encounter (Signed)
Dr. Leafy Ro - Patient moved to [place] and this was processed under her new insurance in [place]. Prior authorization denied for Saxenda. *Per insurance request: last office note (11/08/2021) was sent within the last 30 days per insurance request. Per your health plan's criteria, this drug is covered if you meet the following: (1) Your doctor provides supporting documentation (for example: chart notes or other medical forms) dated within the past 30 days showing one of the following: (A) Your body fat amount (body mass index) is more than or equal to 30kg/m2. (B) Both of the following: (I) Your body fat amount (body mass index) is more than or equal to 27kg/m2. (II) You have another disorder related to weight (for example: high cholesterol, high blood pressure, diabetes, sleep apnea). The information provided does not show that you meet the criteria listed above.  Patient was sent denial message via mychart.

## 2021-11-24 NOTE — Telephone Encounter (Signed)
Patricia Tran, please let her know what Brayton Layman said and how the pt needs to do the appeal for the best chances.

## 2021-11-24 NOTE — Telephone Encounter (Signed)
Please try prior authorization again

## 2021-11-24 NOTE — Telephone Encounter (Signed)
I am unable to complete another prior authorization. This is an appeals. Insurance requested last office note to be sent within the last 30 days (last office note 11/08/2021 sent with prior authorization) and attached to the original prior authorization.

## 2021-11-25 NOTE — Telephone Encounter (Signed)
Patricia Tran, please send the hld diagnosis we already have in the chart to her insurance and then we will be done with this.

## 2021-11-25 NOTE — Telephone Encounter (Signed)
I contacted patient insurance company and patient has filed an appeal. Per [employer], our office faxed the requested information to them today. Reference # [id number] - Patricia Tran - Case Appeal - [id number].

## 2021-11-29 ENCOUNTER — Encounter (INDEPENDENT_AMBULATORY_CARE_PROVIDER_SITE_OTHER): Payer: Self-pay | Admitting: Family Medicine

## 2021-11-29 ENCOUNTER — Telehealth (INDEPENDENT_AMBULATORY_CARE_PROVIDER_SITE_OTHER): Payer: 59 | Admitting: Family Medicine

## 2021-11-29 VITALS — Ht 63.0 in | Wt 156.0 lb

## 2021-11-29 DIAGNOSIS — E66811 Obesity, class 1: Secondary | ICD-10-CM

## 2021-11-29 DIAGNOSIS — E559 Vitamin D deficiency, unspecified: Secondary | ICD-10-CM | POA: Diagnosis not present

## 2021-11-29 DIAGNOSIS — Z6827 Body mass index (BMI) 27.0-27.9, adult: Secondary | ICD-10-CM

## 2021-11-29 DIAGNOSIS — E782 Mixed hyperlipidemia: Secondary | ICD-10-CM | POA: Diagnosis not present

## 2021-11-29 DIAGNOSIS — E669 Obesity, unspecified: Secondary | ICD-10-CM

## 2021-11-29 MED ORDER — SEMAGLUTIDE-WEIGHT MANAGEMENT 1.7 MG/0.75ML ~~LOC~~ SOAJ: 1.7000 mg | SUBCUTANEOUS | 0 refills | Status: DC

## 2021-11-29 NOTE — Progress Notes (Unsigned)
TeleHealth Visit:  Due to the COVID-19 pandemic, this visit was completed with telemedicine (audio/video) technology to reduce patient and provider exposure as well as to preserve personal protective equipment.   Aqua has verbally consented to this TeleHealth visit. The patient is located at home, the provider is located at the [facility] office. The participants in this visit include the listed provider and patient. The visit was conducted today via MyChart video.   Chief Complaint: OBESITY Cailyn is here to discuss her progress with her obesity treatment plan along with follow-up of her obesity related diagnoses. Bernie is on the Category 2 Plan and states she is following her eating plan approximately 50% of the time. Cori states she is doing 0 minutes 0 times per week.  Today's visit was #: 22 Starting weight: 185 lbs Starting date: 08/12/2019  Interim History: Ripley has been struggling more with weight loss, and she would like to start [facility].  She has a very hard with diet and exercise and she had a starting BMI of 33.5 on 07/02/2020.  She was on a GLP-1 previously and did well.  Subjective:   1. Mixed hyperlipidemia Eloyce has uncontrolled high cholesterol with a low HDL, high LDL, and high triglycerides.  She is working on decreasing cholesterol in her diet and she would benefit from a GLP-1 and additional weight loss.  2. Vitamin D deficiency Johnette's last vitamin D level was at goal, and she has no signs of over replacement.  Assessment/Plan:   1. Mixed hyperlipidemia Ritaj is to continue working on her diet and exercise, and she will start [facility] and we we will continue to follow.  2. Vitamin D deficiency Charlesetta will continue as is and she is to have her new PCP check her labs if she cannot do it here at her next visit.  3. Obesity, Current BMI 27.28 Rosalina is currently in the action stage of change. As such, her goal is to continue with weight loss efforts. She has  agreed to the Category 2 Plan.   We discussed various medication options to help Kristyn with her weight loss efforts and we both agreed to start [facility] 1.7 mg once weekly, with a 90 day supply.  - Semaglutide-Weight Management 1.7 MG/0.75ML SOAJ; Inject 1.7 mg into the skin once a week.  Dispense: 9 mL; Refill: 0  Behavioral modification strategies: increasing vegetables.  Marny has agreed to follow-up with our clinic in 4 weeks. She was informed of the importance of frequent follow-up visits to maximize her success with intensive lifestyle modifications for her multiple health conditions.  Objective:   VITALS: Per patient if applicable, see vitals. GENERAL: Alert and in no acute distress. CARDIOPULMONARY: No increased WOB. Speaking in clear sentences.  PSYCH: Pleasant and cooperative. Speech normal rate and rhythm. Affect is appropriate. Insight and judgement are appropriate. Attention is focused, linear, and appropriate.  NEURO: Oriented as arrived to appointment on time with no prompting.   Lab Results  Component Value Date   CREATININE 0.78 09/01/2021   BUN 16 09/01/2021   NA 139 09/01/2021   K 3.8 09/01/2021   CL 105 09/01/2021   CO2 25 09/01/2021   Lab Results  Component Value Date   ALT 31 (H) 09/01/2021   AST 24 09/01/2021   ALKPHOS 120 05/04/2021   BILITOT 0.3 09/01/2021   Lab Results  Component Value Date   HGBA1C 5.0 09/01/2021   HGBA1C 5.5 12/01/2020   HGBA1C 5.4 08/11/2020  HGBA1C 5.4 10/01/2018   Lab Results  Component Value Date   INSULIN 9.0 12/01/2020   INSULIN 10.2 08/11/2020   Lab Results  Component Value Date   TSH 2.13 09/01/2021   Lab Results  Component Value Date   CHOL 184 09/01/2021   HDL 44 (L) 09/01/2021   LDLCALC 108 (H) 09/01/2021   TRIG 203 (H) 09/01/2021   CHOLHDL 4.2 09/01/2021   Lab Results  Component Value Date   VD25OH 82 09/01/2021   VD25OH 57.0 12/01/2020   VD25OH 33.7 08/11/2020   Lab Results  Component Value Date    WBC 6.8 09/01/2021   HGB 10.6 (L) 09/01/2021   HCT 33.8 (L) 09/01/2021   MCV 78.6 (L) 09/01/2021   PLT 362 09/01/2021   Lab Results  Component Value Date   IRON 24 (L) 09/07/2021   TIBC 443.8 09/07/2021   FERRITIN 4.0 (L) 09/07/2021    Attestation Statements:   Reviewed by clinician on day of visit: allergies, medications, problem list, medical history, surgical history, family history, social history, and previous encounter notes.   I, Trixie Dredge, am acting as transcriptionist for Dennard Nip, MD.  I have reviewed the above documentation for accuracy and completeness, and I agree with the above. - Dennard Nip, MD

## 2021-12-02 ENCOUNTER — Encounter (INDEPENDENT_AMBULATORY_CARE_PROVIDER_SITE_OTHER): Payer: Self-pay

## 2021-12-02 ENCOUNTER — Telehealth (INDEPENDENT_AMBULATORY_CARE_PROVIDER_SITE_OTHER): Payer: Self-pay | Admitting: Family Medicine

## 2021-12-02 NOTE — Telephone Encounter (Addendum)
Dr. Leafy Ro - Prior authorization approved for [facility]. Effective: 12/01/2021 - 06/03/2022. Patient sent approval message via mychart.    *Patient also no longer resides in the state of [place] (pt lives in [facility]) and per management patient can no longer be seen by video visits, only in-office visit. Providers can only see patients in  for video visits. Patient sent a mychart message informing patient.

## 2021-12-08 NOTE — Therapy (Signed)
[facility] [address] [place], [place], [address] Phone: [phone]   Fax:  [phone]  Patient Details  Name: Patricia Tran MRN: [id number] Date of Birth: 01-Mar-1971 Referring Provider:  Howard Pouch, MD Encounter Date: 12/08/2021  SPEECH THERAPY DISCHARGE SUMMARY  Visits from Start of Care: 1 (evaluation)  Current functional level related to goals / functional outcomes: Pt did not return for ST after evaluation. She has since relocated to [place]. Goals at eval are as follows: SLP Long Term Goals - 03/22/21 0944                SLP LONG TERM GOAL #1    Title pt will report more successful swallowing with both pharyngeal channels than prior to ST eval     Time 2     Period --   sessions    Status New     Target Date 05/14/21          SLP LONG TERM GOAL #2    Title pt will perform effortful swallow and Mendelsohn maneuver with modified independence     Time 2     Period --   sessions    Status New     Target Date 05/14/21                     Plan - 03/22/21 0933       Clinical Impression Statement [name] presents today with reported sx of difficulty swallowing saliva and secretions but states she does not note any difficulty during meals, even with dryer or "heavier" items. Occasionally, she tells SLP a pill will not transfer through the pharynx and she will need to expectorate it and administer another one. She had extrememly thick greek yogurt just prior to arriving and did not have any problems noted with that. Pt had cereal bar and Gatorade with SLP, without challenge until SLP had pt turn head to rt, at which time she winced slightly and told SLP she had discomfort and that "most of it went down this (left channel) way." This feeling occurred less and less as pt turned her head to the rt during the next 7 boluses during this PO trial. 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 Pt expressed thanks to SLP and SLP provided and taught pt 2 pharyngeal exercises pt will complete over the next 4-6 weeks. She scheduled a follow up appointment for 4 weeks but will call to cancel if she feels it is not necessary.     Speech Therapy Frequency Monthly     Duration --   2 sessions    Treatment/Interventions Pharyngeal strengthening exercises;Diet toleration management by SLP;Trials of upgraded texture/liquids;Patient/family education;SLP instruction and feedback     Potential to Achieve Goals Good     SLP Home Exercise Plan provided     Consulted and Agree with Plan of Care Patient        Remaining deficits: Assumed deficits still remain.   Education / Equipment: See above   Patient agrees to discharge. Patient goals were not met. Patient is being discharged due to not returning since the last visit.Marland Kitchen      [place], [place] 12/08/2021, 9:01 AM  [facility] [address],  [place], [place], [address] Phone: [phone]   Fax:  [phone]

## 2021-12-12 ENCOUNTER — Other Ambulatory Visit (INDEPENDENT_AMBULATORY_CARE_PROVIDER_SITE_OTHER): Payer: Self-pay | Admitting: Family Medicine

## 2021-12-12 DIAGNOSIS — E669 Obesity, unspecified: Secondary | ICD-10-CM

## 2021-12-16 ENCOUNTER — Encounter (INDEPENDENT_AMBULATORY_CARE_PROVIDER_SITE_OTHER): Payer: Self-pay | Admitting: Family Medicine

## 2021-12-16 DIAGNOSIS — R632 Polyphagia: Secondary | ICD-10-CM

## 2021-12-16 DIAGNOSIS — R7303 Prediabetes: Secondary | ICD-10-CM

## 2021-12-20 MED ORDER — SAXENDA 18 MG/3ML ~~LOC~~ SOPN: 3.0000 mg | PEN_INJECTOR | Freq: Every day | SUBCUTANEOUS | 0 refills | Status: DC

## 2021-12-20 NOTE — Telephone Encounter (Signed)
Ok to send in 90 days

## 2021-12-30 ENCOUNTER — Encounter (INDEPENDENT_AMBULATORY_CARE_PROVIDER_SITE_OTHER): Payer: Self-pay | Admitting: Family Medicine

## 2021-12-30 ENCOUNTER — Ambulatory Visit (INDEPENDENT_AMBULATORY_CARE_PROVIDER_SITE_OTHER): Payer: 59 | Admitting: Family Medicine

## 2021-12-30 VITALS — BP 102/67 | HR 111 | Temp 98.1°F | Ht 63.0 in | Wt 155.0 lb

## 2021-12-30 DIAGNOSIS — E559 Vitamin D deficiency, unspecified: Secondary | ICD-10-CM

## 2021-12-30 DIAGNOSIS — R739 Hyperglycemia, unspecified: Secondary | ICD-10-CM | POA: Diagnosis not present

## 2021-12-30 DIAGNOSIS — Z6827 Body mass index (BMI) 27.0-27.9, adult: Secondary | ICD-10-CM | POA: Diagnosis not present

## 2021-12-30 DIAGNOSIS — E669 Obesity, unspecified: Secondary | ICD-10-CM | POA: Diagnosis not present

## 2021-12-30 MED ORDER — SAXENDA 18 MG/3ML ~~LOC~~ SOPN
3.0000 mg | PEN_INJECTOR | Freq: Every day | SUBCUTANEOUS | 0 refills | Status: DC
Start: 2021-12-30 — End: 2022-01-06

## 2021-12-30 MED ORDER — VITAMIN D (ERGOCALCIFEROL) 1.25 MG (50000 UNIT) PO CAPS: 50000.0000 [IU] | ORAL_CAPSULE | ORAL | 0 refills | Status: AC

## 2022-01-05 ENCOUNTER — Encounter (INDEPENDENT_AMBULATORY_CARE_PROVIDER_SITE_OTHER): Payer: Self-pay | Admitting: Family Medicine

## 2022-01-05 DIAGNOSIS — E669 Obesity, unspecified: Secondary | ICD-10-CM

## 2022-01-05 NOTE — Progress Notes (Signed)
Chief Complaint:   OBESITY Patricia Tran is here to discuss her progress with her obesity treatment plan along with follow-up of her obesity related diagnoses. Patricia Tran is on the Category 2 Plan and states she is following her eating plan approximately 50% of the time. Patricia Tran states she is doing 0 minutes 0 times per week.  Today's visit was #: 23 Starting weight: 185 lbs Starting date: 08/12/2019 Today's weight: 155 lbs Today's date: 12/30/2021 Total lbs lost to date: 30 Total lbs lost since last in-office visit: 1  Interim History: Patricia Tran had been on Saxenda but she was changed to [facility] due to the convenience of the [place] dose. She had significant vomiting, so she stopped taking it.   Subjective:   1. Vitamin D deficiency Patricia Tran is on Vitamin D prescription and her recent labs were 35 at her PCP (not in Epic).   2. Hyperglycemia Patricia Tran's A1c is at goal with diet, exercise, and weight loss. She has no signs of hypoglycemia.   Assessment/Plan:   1. Vitamin D deficiency We will refill prescription Vitamin D for 90 days and will recheck labs in 3 months; (patient is to take every other week and rechec at her PCP in 3 months).  - Vitamin D, Ergocalciferol, (DRISDOL) 1.25 MG (50000 UNIT) CAPS capsule; Take 1 capsule (50,000 Units total) by mouth every 7 (seven) days.  Dispense: 12 capsule; Refill: 0  2. Hyperglycemia Patricia Tran will continue with her diet and exercise, and we will recheck labs in 6 weeks.   3. Obesity, current BMI 27.5 Patricia Tran is currently in the action stage of change. As such, her goal is to continue with weight loss efforts. She has agreed to the Category 2 Plan.   We discussed various medication options to help Patricia Tran with her weight loss efforts and we both agreed to continue Saxenda 3 mg daily, and we will refill for 90 days.  - Liraglutide -Weight Management (SAXENDA) 18 MG/3ML SOPN; Inject 3 mg into the skin daily.  Dispense: 45 mL; Refill: 0  Behavioral modification strategies:  increasing lean protein intake.  Patricia Tran has agreed to follow-up with our clinic in 3 months. She was informed of the importance of frequent follow-up visits to maximize her success with intensive lifestyle modifications for her multiple health conditions.   Objective:   Blood pressure 102/67, pulse (!) 111, temperature 98.1 F (36.7 C), height '5\' 3"'$  (1.6 m), weight 155 lb (70.3 kg), SpO2 97 %. Body mass index is 27.46 kg/m.  General: Cooperative, alert, well developed, in no acute distress. HEENT: Conjunctivae and lids unremarkable. Cardiovascular: Regular rhythm.  Lungs: Normal work of breathing. Neurologic: No focal deficits.   Lab Results  Component Value Date   CREATININE 0.78 09/01/2021   BUN 16 09/01/2021   NA 139 09/01/2021   K 3.8 09/01/2021   CL 105 09/01/2021   CO2 25 09/01/2021   Lab Results  Component Value Date   ALT 31 (H) 09/01/2021   AST 24 09/01/2021   ALKPHOS 120 05/04/2021   BILITOT 0.3 09/01/2021   Lab Results  Component Value Date   HGBA1C 5.0 09/01/2021   HGBA1C 5.5 12/01/2020   HGBA1C 5.4 08/11/2020   HGBA1C 5.4 10/01/2018   Lab Results  Component Value Date   INSULIN 9.0 12/01/2020   INSULIN 10.2 08/11/2020   Lab Results  Component Value Date   TSH 2.13 09/01/2021   Lab Results  Component Value Date   CHOL 184 09/01/2021   HDL 44 (  L) 09/01/2021   LDLCALC 108 (H) 09/01/2021   TRIG 203 (H) 09/01/2021   CHOLHDL 4.2 09/01/2021   Lab Results  Component Value Date   VD25OH 82 09/01/2021   VD25OH 57.0 12/01/2020   VD25OH 33.7 08/11/2020   Lab Results  Component Value Date   WBC 6.8 09/01/2021   HGB 10.6 (L) 09/01/2021   HCT 33.8 (L) 09/01/2021   MCV 78.6 (L) 09/01/2021   PLT 362 09/01/2021   Lab Results  Component Value Date   IRON 24 (L) 09/07/2021   TIBC 443.8 09/07/2021   FERRITIN 4.0 (L) 09/07/2021   Attestation Statements:   Reviewed by clinician on day of visit: allergies, medications, problem list, medical history,  surgical history, family history, social history, and previous encounter notes.  Time spent on visit including pre-visit chart review and post-visit care and charting was 44 minutes.   I, Trixie Dredge, am acting as transcriptionist for Dennard Nip, MD.  I have reviewed the above documentation for accuracy and completeness, and I agree with the above. -  Dennard Nip, MD

## 2022-01-06 MED ORDER — WEGOVY 0.5 MG/0.5ML ~~LOC~~ SOAJ: 0.5000 mg | SUBCUTANEOUS | 0 refills | Status: AC

## 2022-01-06 NOTE — Telephone Encounter (Signed)
Please send in wegovy .'25mg'$  qweek x 90 days

## 2022-01-31 ENCOUNTER — Other Ambulatory Visit (INDEPENDENT_AMBULATORY_CARE_PROVIDER_SITE_OTHER): Payer: Self-pay | Admitting: Family Medicine

## 2022-01-31 DIAGNOSIS — E559 Vitamin D deficiency, unspecified: Secondary | ICD-10-CM

## 2022-03-03 ENCOUNTER — Encounter (INDEPENDENT_AMBULATORY_CARE_PROVIDER_SITE_OTHER): Payer: Self-pay | Admitting: Family Medicine

## 2022-03-30 ENCOUNTER — Other Ambulatory Visit (INDEPENDENT_AMBULATORY_CARE_PROVIDER_SITE_OTHER): Payer: Self-pay | Admitting: Family Medicine

## 2022-03-30 DIAGNOSIS — E559 Vitamin D deficiency, unspecified: Secondary | ICD-10-CM

## 2022-05-08 IMAGING — MG MM DIGITAL SCREENING BILAT W/ TOMO AND CAD
8 series · 8 of 24 positions shown · non-contrast
Comparison: Previous exam(s).

CLINICAL DATA: Screening.

EXAM:
DIGITAL SCREENING BILATERAL MAMMOGRAM WITH TOMOSYNTHESIS AND CAD
TECHNIQUE: Bilateral screening digital craniocaudal and mediolateral oblique
mammograms were obtained. Bilateral screening digital breast
tomosynthesis was performed. The images were evaluated with
computer-aided detection.

[R CC synth-2D]
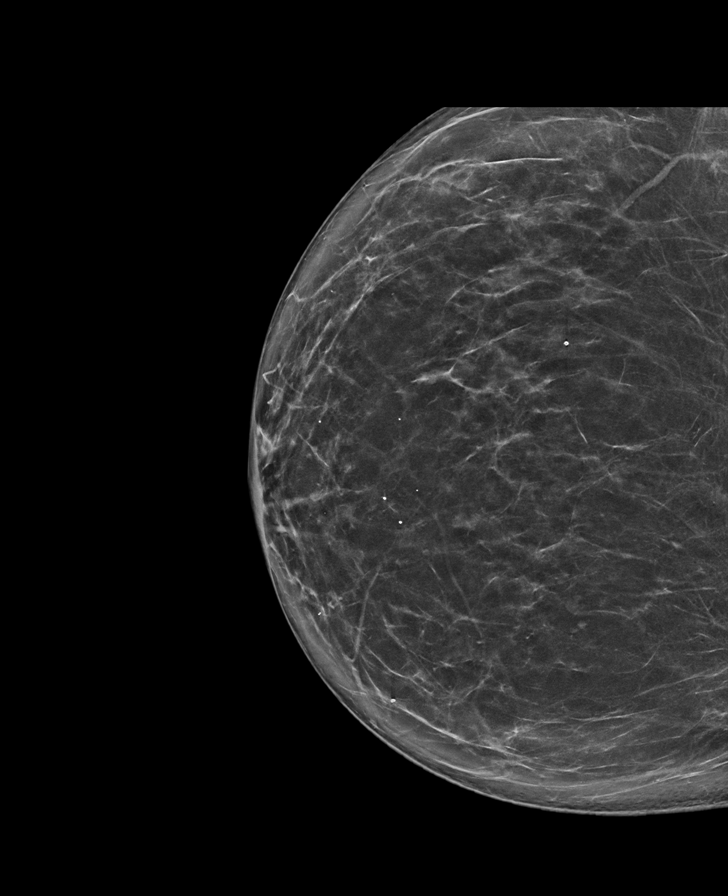

[L MLO synth-2D]
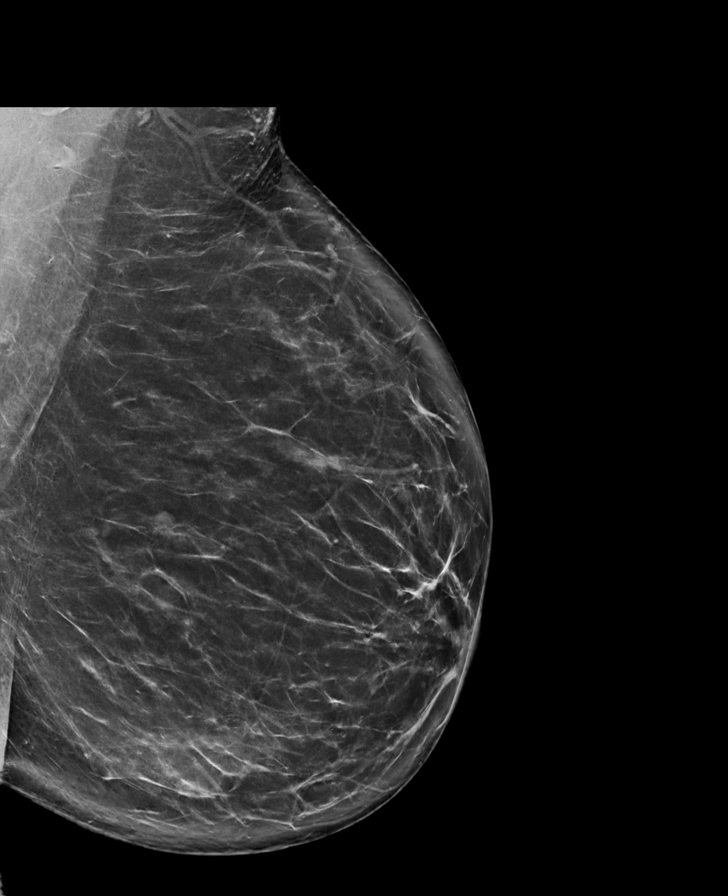

[L CC synth-2D]
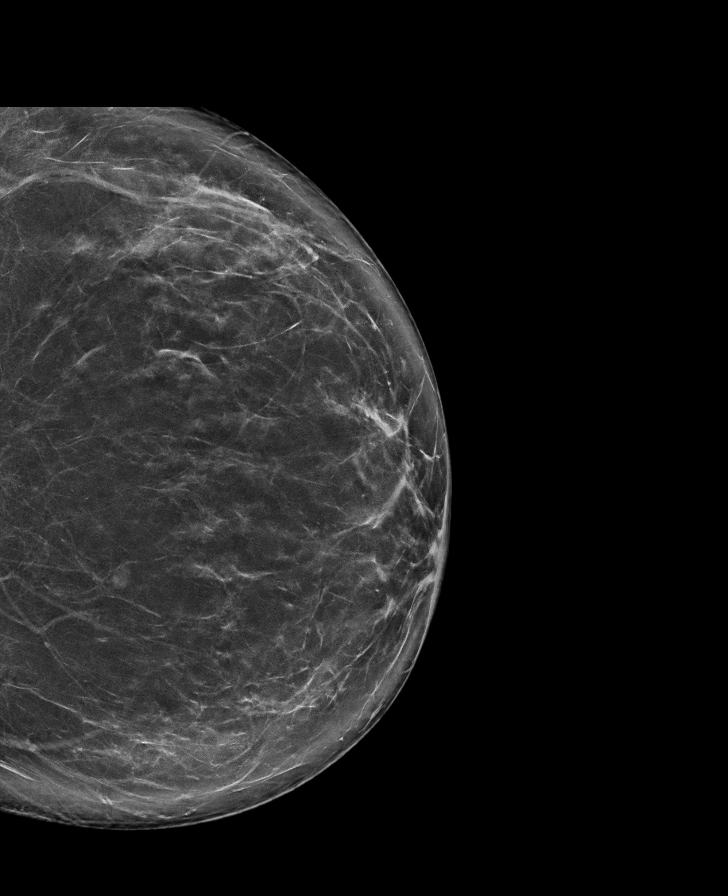

[R MLO synth-2D]
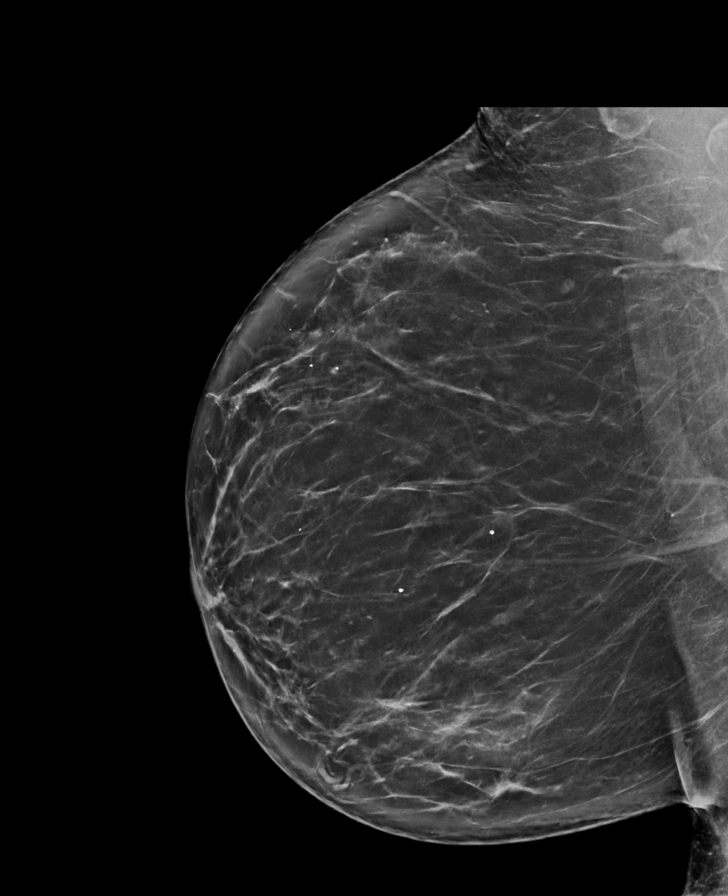

[R CC tomo · tomo slice 45/88.0]
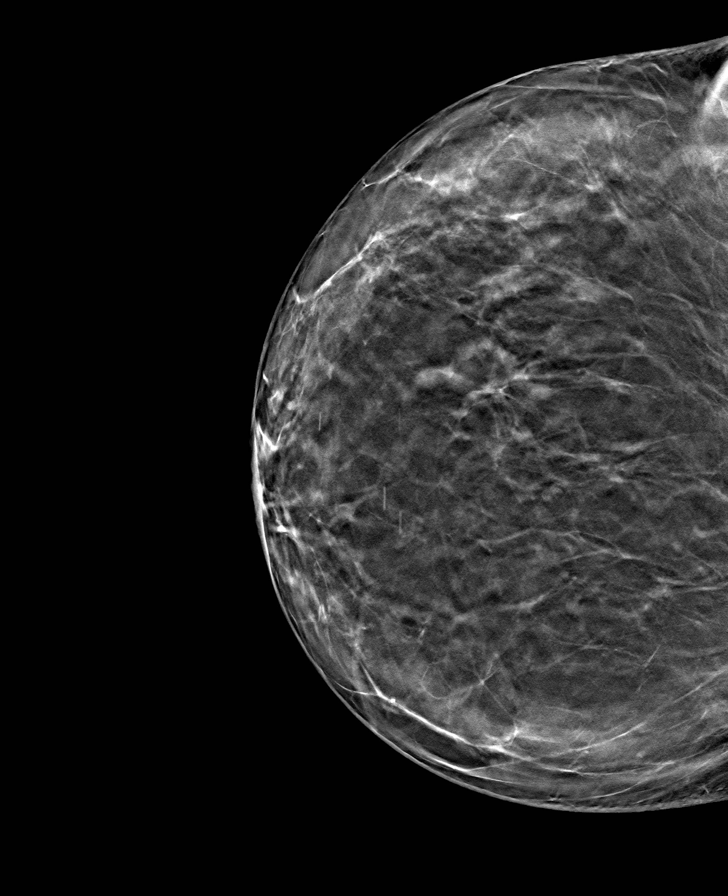

[L MLO tomo · tomo slice 48/95.0]
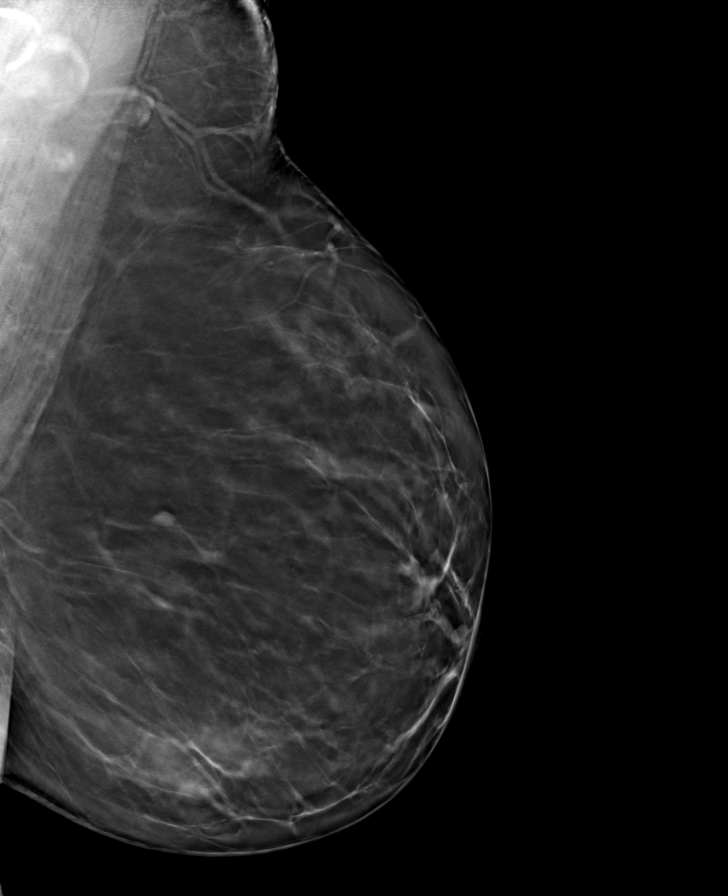

[L CC tomo · tomo slice 47/92.0]
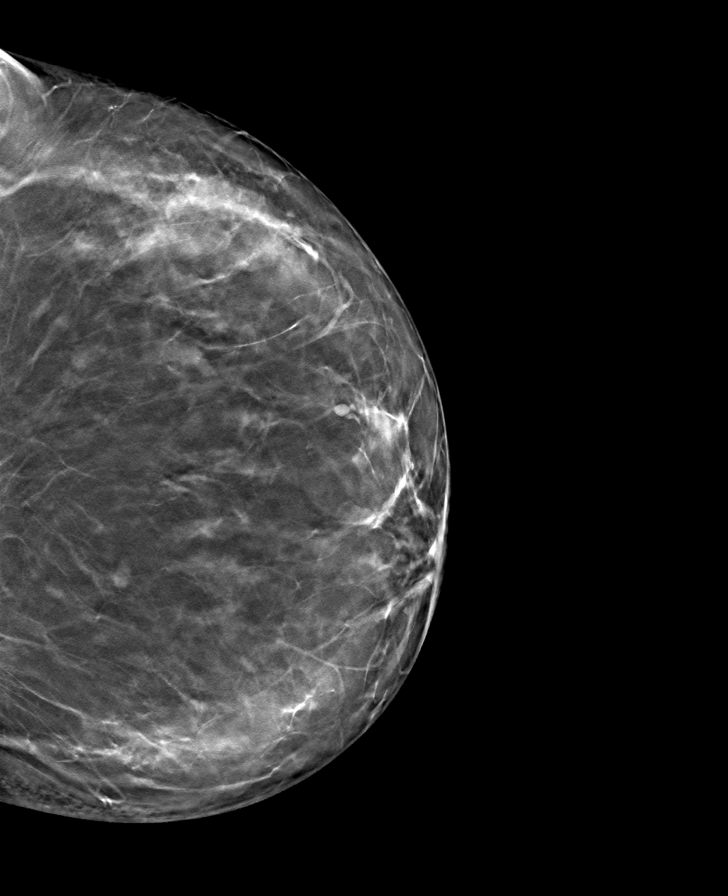

[R MLO tomo · tomo slice 49/98.0]
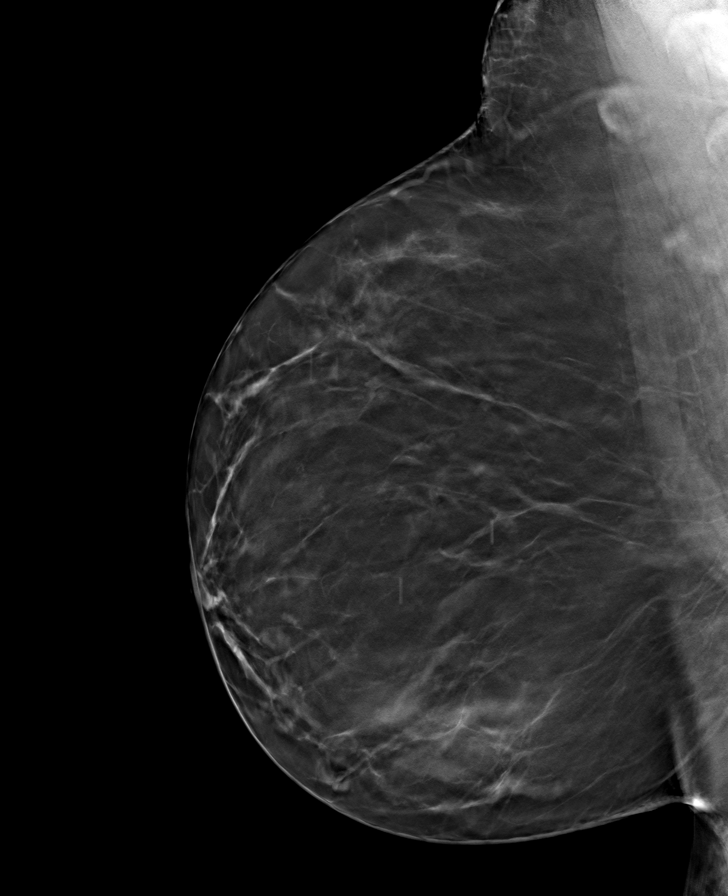

[8 of 24 positions shown; findings below may reference images not displayed]

ACR Breast Density Category b: There are scattered areas of
fibroglandular density.
FINDINGS: There are no findings suspicious for malignancy. The images were
evaluated with computer-aided detection.
IMPRESSION: No mammographic evidence of malignancy. A result letter of this
screening mammogram will be mailed directly to the patient.

RECOMMENDATION:
Screening mammogram in one year. (Code:WJ-I-BG6)

BI-RADS CATEGORY  1: Negative.

## 2022-09-07 ENCOUNTER — Encounter: Payer: 59 | Admitting: Family Medicine
